# Patient Record
Sex: Female | Born: 1965 | Race: Black or African American | Hispanic: No | Marital: Married | Smoking: Never smoker
Health system: Southern US, Community
[De-identification: ages and names within clinical notes are randomized; demographics above are authoritative.]

## PROBLEM LIST (undated history)

## (undated) DIAGNOSIS — D649 Anemia, unspecified: Secondary | ICD-10-CM

## (undated) DIAGNOSIS — Z889 Allergy status to unspecified drugs, medicaments and biological substances status: Secondary | ICD-10-CM

## (undated) DIAGNOSIS — R011 Cardiac murmur, unspecified: Secondary | ICD-10-CM

## (undated) HISTORY — PX: TUBAL LIGATION: SHX77

## (undated) HISTORY — PX: DILATION AND CURETTAGE OF UTERUS: SHX78

## (undated) HISTORY — PX: GANGLION CYST EXCISION: SHX1691

---

## 2002-12-17 ENCOUNTER — Encounter (INDEPENDENT_AMBULATORY_CARE_PROVIDER_SITE_OTHER): Payer: Self-pay | Admitting: *Deleted

## 2002-12-17 ENCOUNTER — Ambulatory Visit (HOSPITAL_BASED_OUTPATIENT_CLINIC_OR_DEPARTMENT_OTHER): Admission: RE | Admit: 2002-12-17 | Discharge: 2002-12-17 | Payer: Self-pay | Admitting: Orthopedic Surgery

## 2006-05-07 ENCOUNTER — Encounter: Admission: RE | Admit: 2006-05-07 | Discharge: 2006-05-07 | Payer: Self-pay | Admitting: Obstetrics and Gynecology

## 2007-02-13 ENCOUNTER — Encounter: Admission: RE | Admit: 2007-02-13 | Discharge: 2007-02-13 | Payer: Self-pay | Admitting: Obstetrics and Gynecology

## 2008-03-01 ENCOUNTER — Encounter: Admission: RE | Admit: 2008-03-01 | Discharge: 2008-03-01 | Payer: Self-pay | Admitting: Internal Medicine

## 2008-03-05 ENCOUNTER — Encounter: Admission: RE | Admit: 2008-03-05 | Discharge: 2008-03-05 | Payer: Self-pay | Admitting: Internal Medicine

## 2008-05-27 ENCOUNTER — Ambulatory Visit (HOSPITAL_COMMUNITY): Admission: RE | Admit: 2008-05-27 | Discharge: 2008-05-27 | Payer: Self-pay | Admitting: Obstetrics and Gynecology

## 2009-03-01 ENCOUNTER — Encounter: Admission: RE | Admit: 2009-03-01 | Discharge: 2009-03-01 | Payer: Self-pay | Admitting: Internal Medicine

## 2009-09-08 ENCOUNTER — Ambulatory Visit (HOSPITAL_COMMUNITY): Admission: RE | Admit: 2009-09-08 | Discharge: 2009-09-08 | Payer: Self-pay | Admitting: Gastroenterology

## 2009-09-20 ENCOUNTER — Ambulatory Visit (HOSPITAL_COMMUNITY): Admission: RE | Admit: 2009-09-20 | Discharge: 2009-09-20 | Payer: Self-pay | Admitting: Gastroenterology

## 2010-03-02 ENCOUNTER — Encounter: Admission: RE | Admit: 2010-03-02 | Discharge: 2010-03-02 | Payer: Self-pay | Admitting: Obstetrics and Gynecology

## 2010-07-31 ENCOUNTER — Encounter: Payer: Self-pay | Admitting: Internal Medicine

## 2010-08-31 ENCOUNTER — Other Ambulatory Visit: Payer: Self-pay | Admitting: Obstetrics and Gynecology

## 2010-08-31 DIAGNOSIS — Z1231 Encounter for screening mammogram for malignant neoplasm of breast: Secondary | ICD-10-CM

## 2010-11-24 NOTE — Op Note (Signed)
NAMETerrace Johnston                           ACCOUNT NO.:  [id number]   MEDICAL RECORD NO.:  [id number]                   PATIENT TYPE:  AMB   LOCATION:  DSC                                  FACILITY:  [facility]   PHYSICIAN:  Katy Fitch. Naaman Plummer., M.D.          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   OPERATION PERFORMED:  Excision of a large ganglion dorsal aspect of left  wrist with arthrotomy of midcarpal and radiocarpal joints and debridement of  mucinous tissues off dorsal surface of scapholunate interosseous ligament.   SURGEON:  Katy Fitch. Sypher, M.D.   ASSISTANT:  Jonni Sanger, P.A.   ANESTHESIA:  General by LMA.   SUPERVISING ANESTHESIOLOGIST:  Janetta Hora. Gelene Mink, M.D.   INDICATIONS FOR PROCEDURE:  Kristin Johnston is a 45 year old woman who  presented for evaluation and management of a large mass on the dorsal aspect  of her left wrist.  More than five years ago, she has a mass excised from  the dorsal aspect of the wrist consistent with a ganglion.  After about one  year she had a recurrence.  She has tolerated this for a while but now has a  very large and unsightly mass on the dorsal aspect of her left wrist. She  requested a second attempted excision.  We explained to her that the  ganglion phenomenon is poorly understood.  I explained that conventional  treatment involved circumferential dissection of the mass and inspection of  the radiocarpal and midcarpal joints in an effort to remove the cells that  were creating mucinous joint type fluid on the exterior of the wrist  capsule.   We also recommended exploration of the scapholunate interosseous ligament  region  looking for injury to the ligament, possibly provoking the ganglion  phenomenon.  She understands that despite our best efforts these can recur.   DESCRIPTION OF PROCEDURE:  Kristin Johnston was brought to the operating room  and placed in supine position on the operating table.  Following induction  of general anesthesia by LMA, the left arm was prepped with Betadine soap  and solution and sterilely draped.  Following exsanguination of the limb  with an Esmarch bandage, an arterial tourniquet on the proximal brachium was  inflated to 220 mmHg.  The procedure commenced with an elliptical excision  of skin to remove skin that had been stretched by the enlarging mass.   Subcutaneous tissue was carefully divided taking care to identify the  extensor retinaculum.  The cyst was circumferentially dissected sparing the  retinaculum.  This was followed down to the level of the wrist capsule.  An  arthrotomy of the midcarpal and radiocarpal joints was accomplished followed  by following the stem of the lesion to the region of scapholunate  interosseous ligament.  There was mucinous tissue on the dorsal aspect of  the scapholunate interosseous ligament and some abnormal yellow-appearing  ligament fibers but no sign of ligament injury.  All of the abnormal tissues  were debrided with a rongeur.  The midcarpal arthrotomy and radiocarpal  arthrotomy sites were repaired with mattress sutures of 4-0 Vicryl followed  by irrigation of the wound and repair of the skin with subdermal sutures of  4-0 Vicryl and intradermal 3-0 Prolene.  There were no other apparent  pathologies or complications noted.   Kristin Johnston was placed in a compressive dressing with a volar plaster splint  maintaining the wrist in five degrees dorsiflexion.  For aftercare she was  given prescriptions for Percocet 5 mg one or two tablets by mouth every four  to six hours as needed for pain, 20 tablets without refill.  Also Keflex 500  mg 1  by mouth every eight hours times four days as a postoperative  prophylactic antibiotic.  She was given 12 tablets.                                                Katy Fitch Naaman Plummer., M.D.    RVS/MEDQ  D:  12/17/2002  T:  12/18/2002  Job:  [id number]

## 2011-03-05 ENCOUNTER — Ambulatory Visit
Admission: RE | Admit: 2011-03-05 | Discharge: 2011-03-05 | Disposition: A | Discharge status: Home / Self Care | Payer: BC Managed Care – PPO | Source: Ambulatory Visit | Attending: Obstetrics and Gynecology | Admitting: Obstetrics and Gynecology

## 2011-03-05 DIAGNOSIS — Z1231 Encounter for screening mammogram for malignant neoplasm of breast: Secondary | ICD-10-CM

## 2011-09-07 ENCOUNTER — Other Ambulatory Visit: Payer: Self-pay | Admitting: Obstetrics and Gynecology

## 2011-09-07 DIAGNOSIS — Z1231 Encounter for screening mammogram for malignant neoplasm of breast: Secondary | ICD-10-CM

## 2012-03-05 ENCOUNTER — Ambulatory Visit: Payer: BC Managed Care – PPO

## 2012-03-05 ENCOUNTER — Ambulatory Visit
Admission: RE | Admit: 2012-03-05 | Discharge: 2012-03-05 | Disposition: A | Discharge status: Home / Self Care | Payer: BC Managed Care – PPO | Source: Ambulatory Visit | Attending: Obstetrics and Gynecology | Admitting: Obstetrics and Gynecology

## 2012-03-05 DIAGNOSIS — Z1231 Encounter for screening mammogram for malignant neoplasm of breast: Secondary | ICD-10-CM

## 2013-03-05 ENCOUNTER — Other Ambulatory Visit: Payer: Self-pay

## 2013-03-05 DIAGNOSIS — Z1231 Encounter for screening mammogram for malignant neoplasm of breast: Secondary | ICD-10-CM

## 2013-03-23 ENCOUNTER — Ambulatory Visit
Admission: RE | Admit: 2013-03-23 | Discharge: 2013-03-23 | Disposition: A | Discharge status: Home / Self Care | Payer: BC Managed Care – PPO | Source: Ambulatory Visit

## 2013-03-23 DIAGNOSIS — Z1231 Encounter for screening mammogram for malignant neoplasm of breast: Secondary | ICD-10-CM

## 2015-02-23 ENCOUNTER — Other Ambulatory Visit: Payer: Self-pay

## 2015-02-23 DIAGNOSIS — Z1231 Encounter for screening mammogram for malignant neoplasm of breast: Secondary | ICD-10-CM

## 2015-02-28 ENCOUNTER — Ambulatory Visit
Admission: RE | Admit: 2015-02-28 | Discharge: 2015-02-28 | Disposition: A | Discharge status: Home / Self Care | Payer: BC Managed Care – PPO | Source: Ambulatory Visit

## 2015-02-28 DIAGNOSIS — Z1231 Encounter for screening mammogram for malignant neoplasm of breast: Secondary | ICD-10-CM

## 2015-03-07 ENCOUNTER — Other Ambulatory Visit: Payer: Self-pay | Admitting: Obstetrics and Gynecology

## 2015-03-07 DIAGNOSIS — N926 Irregular menstruation, unspecified: Secondary | ICD-10-CM

## 2015-03-07 DIAGNOSIS — D259 Leiomyoma of uterus, unspecified: Secondary | ICD-10-CM

## 2015-03-09 ENCOUNTER — Ambulatory Visit
Admission: RE | Admit: 2015-03-09 | Discharge: 2015-03-09 | Disposition: A | Discharge status: Home / Self Care | Payer: BC Managed Care – PPO | Source: Ambulatory Visit | Attending: Obstetrics and Gynecology | Admitting: Obstetrics and Gynecology

## 2015-03-09 DIAGNOSIS — N926 Irregular menstruation, unspecified: Secondary | ICD-10-CM

## 2015-03-09 DIAGNOSIS — D259 Leiomyoma of uterus, unspecified: Secondary | ICD-10-CM

## 2016-03-07 ENCOUNTER — Other Ambulatory Visit: Payer: Self-pay | Admitting: Internal Medicine

## 2016-03-07 DIAGNOSIS — Z1231 Encounter for screening mammogram for malignant neoplasm of breast: Secondary | ICD-10-CM

## 2016-03-14 ENCOUNTER — Other Ambulatory Visit: Payer: Self-pay | Admitting: Gastroenterology

## 2016-03-15 ENCOUNTER — Ambulatory Visit
Admission: RE | Admit: 2016-03-15 | Discharge: 2016-03-15 | Disposition: A | Discharge status: Home / Self Care | Payer: BC Managed Care – PPO | Source: Ambulatory Visit | Attending: Internal Medicine | Admitting: Internal Medicine

## 2016-03-15 DIAGNOSIS — Z1231 Encounter for screening mammogram for malignant neoplasm of breast: Secondary | ICD-10-CM

## 2016-04-16 ENCOUNTER — Encounter (HOSPITAL_COMMUNITY): Payer: Self-pay | Admitting: *Deleted

## 2016-04-23 ENCOUNTER — Encounter (HOSPITAL_COMMUNITY)
Admission: RE | Disposition: A | Discharge status: Home / Self Care | Payer: Self-pay | Source: Ambulatory Visit | Attending: Gastroenterology

## 2016-04-23 ENCOUNTER — Ambulatory Visit (HOSPITAL_COMMUNITY)
Admission: RE | Admit: 2016-04-23 | Discharge: 2016-04-23 | Disposition: A | Discharge status: Home / Self Care | Payer: BC Managed Care – PPO | Source: Ambulatory Visit | Attending: Gastroenterology | Admitting: Gastroenterology

## 2016-04-23 ENCOUNTER — Ambulatory Visit (HOSPITAL_COMMUNITY): Payer: BC Managed Care – PPO | Admitting: Certified Registered Nurse Anesthetist

## 2016-04-23 ENCOUNTER — Encounter (HOSPITAL_COMMUNITY): Payer: Self-pay

## 2016-04-23 DIAGNOSIS — K562 Volvulus: Secondary | ICD-10-CM | POA: Insufficient documentation

## 2016-04-23 DIAGNOSIS — Z1211 Encounter for screening for malignant neoplasm of colon: Secondary | ICD-10-CM | POA: Diagnosis not present

## 2016-04-23 HISTORY — DX: Cardiac murmur, unspecified: R01.1

## 2016-04-23 HISTORY — DX: Allergy status to unspecified drugs, medicaments and biological substances: Z88.9

## 2016-04-23 HISTORY — PX: COLONOSCOPY WITH PROPOFOL: SHX5780

## 2016-04-23 HISTORY — DX: Anemia, unspecified: D64.9

## 2016-04-23 SURGERY — COLONOSCOPY WITH PROPOFOL
Anesthesia: Monitor Anesthesia Care

## 2016-04-23 MED ORDER — LACTATED RINGERS IV SOLN
INTRAVENOUS | Status: DC
  Administered 2016-04-23: 11:00:00 via INTRAVENOUS

## 2016-04-23 MED ORDER — PROPOFOL 10 MG/ML IV BOLUS
INTRAVENOUS | Status: DC | PRN
  Administered 2016-04-23 (×2): 20 mg via INTRAVENOUS

## 2016-04-23 MED ORDER — PROPOFOL 500 MG/50ML IV EMUL
INTRAVENOUS | Status: DC | PRN
  Administered 2016-04-23: 150 ug/kg/min via INTRAVENOUS

## 2016-04-23 MED ORDER — LIDOCAINE 2% (20 MG/ML) 5 ML SYRINGE
INTRAMUSCULAR | Status: AC
  Filled 2016-04-23: qty 5

## 2016-04-23 MED ORDER — ONDANSETRON HCL 4 MG/2ML IJ SOLN
INTRAMUSCULAR | Status: DC | PRN
  Administered 2016-04-23: 4 mg via INTRAVENOUS

## 2016-04-23 MED ORDER — PROPOFOL 10 MG/ML IV BOLUS
INTRAVENOUS | Status: AC
  Filled 2016-04-23: qty 40

## 2016-04-23 MED ORDER — LIDOCAINE 2% (20 MG/ML) 5 ML SYRINGE
INTRAMUSCULAR | Status: DC | PRN
  Administered 2016-04-23: 60 mg via INTRAVENOUS

## 2016-04-23 MED ORDER — ONDANSETRON HCL 4 MG/2ML IJ SOLN
INTRAMUSCULAR | Status: AC
  Filled 2016-04-23: qty 2

## 2016-04-23 MED ORDER — SODIUM CHLORIDE 0.9 % IV SOLN: INTRAVENOUS | Status: DC

## 2016-04-23 SURGICAL SUPPLY — 21 items

## 2016-04-23 NOTE — Transfer of Care (Signed)
Immediate Anesthesia Transfer of Care Note  Patient: Kristin Johnston  Procedure(s) Performed: Procedure(s): COLONOSCOPY WITH PROPOFOL (N/A)  Patient Location: PACU  Anesthesia Type:MAC  Level of Consciousness:  sedated, patient cooperative and responds to stimulation  Airway & Oxygen Therapy:Patient Spontanous Breathing and Patient connected to face mask oxgen  Post-op Assessment:  Report given to PACU RN and Post -op Vital signs reviewed and stable  Post vital signs:  Reviewed and stable  Last Vitals:  Vitals:   04/23/16 1116 04/23/16 1307  BP: (!) 152/91 (!) 157/89  Pulse: (!) 12 61  Resp: 12 17  Temp: [id number] C     Complications: No apparent anesthesia complications

## 2016-04-23 NOTE — Op Note (Addendum)
[facility] Patient Name: Kristin Johnston Procedure Date: 04/23/2016 MRN: [id number] Attending MD: Garlan Fair , MD Date of Birth: 1966-05-29 CSN: [id number] Age: 50 Admit Type: Outpatient Procedure:                Colonoscopy Indications:              Screening for colorectal malignant neoplasm Providers:                Garlan Fair, MD, Malka So, RN, Alfonso Patten, Technician, Edman Circle. Zenia Resides CRNA, CRNA Referring MD:              Medicines:                Propofol per Anesthesia Complications:            No immediate complications. Estimated Blood Loss:     Estimated blood loss: none. Procedure:                Pre-Anesthesia Assessment:                           - Prior to the procedure, a History and Physical                            was performed, and patient medications and                            allergies were reviewed. The patient's tolerance of                            previous anesthesia was also reviewed. The risks                            and benefits of the procedure and the sedation                            options and risks were discussed with the patient.                            All questions were answered, and informed consent                            was obtained. Prior Anticoagulants: The patient has                            taken no previous anticoagulant or antiplatelet                            agents. ASA Grade Assessment: II - A patient with                            mild systemic disease. After reviewing the risks  and benefits, the patient was deemed in                            satisfactory condition to undergo the procedure.                           After obtaining informed consent, the colonoscope                            was passed under direct vision. Throughout the                            procedure, the patient's blood pressure, pulse, and                             oxygen saturations were monitored continuously. The                            [id number] scope was introduced through                            the anus and advanced to the the cecum, identified                            by appendiceal orifice and ileocecal valve. The                            colonoscopy was somewhat difficult due to                            significant looping. The patient tolerated the                            procedure well. The quality of the bowel                            preparation was good. The ileocecal valve, the                            appendiceal orifice and the rectum were                            photographed. Scope In: 12:38:17 PM Scope Out: 1:00:37 PM Scope Withdrawal Time: 0 hours 7 minutes 56 seconds  Total Procedure Duration: 0 hours 22 minutes 20 seconds  Findings:      The perianal and digital rectal examinations were normal.      The entire examined colon appeared normal. Impression:               - The entire examined colon is normal.                           - No specimens collected. Moderate Sedation:      N/A- Per Anesthesia Care Recommendation:           -  Patient has a contact number available for                            emergencies. The signs and symptoms of potential                            delayed complications were discussed with the                            patient. Return to normal activities tomorrow.                            Written discharge instructions were provided to the                            patient.                           - Repeat colonoscopy in 10 years for screening                            purposes.                           - Resume previous diet.                           - Continue present medications. Procedure Code(s):        --- Professional ---                           [id number], Colorectal cancer screening; colonoscopy on                             individual not meeting criteria for high risk Diagnosis Code(s):        --- Professional ---                           Z12.11, Encounter for screening for malignant                            neoplasm of colon CPT copyright 2016 American [employer] Association. All rights reserved. The codes documented in this report are preliminary and upon coder review may  be revised to meet current compliance requirements. Earle Gell, MD Garlan Fair, MD 04/23/2016 1:06:11 PM This report has been signed electronically. Number of Addenda: 0

## 2016-04-23 NOTE — Discharge Instructions (Signed)

## 2016-04-23 NOTE — Anesthesia Postprocedure Evaluation (Signed)
Anesthesia Post Note  Patient: Kristin Johnston  Procedure(s) Performed: Procedure(s) (LRB): COLONOSCOPY WITH PROPOFOL (N/A)  Patient location during evaluation: Endoscopy Anesthesia Type: MAC Level of consciousness: awake and alert Pain management: pain level controlled Vital Signs Assessment: post-procedure vital signs reviewed and stable Respiratory status: spontaneous breathing, nonlabored ventilation, respiratory function stable and patient connected to nasal cannula oxygen Cardiovascular status: stable and blood pressure returned to baseline Anesthetic complications: no    Last Vitals:  Vitals:   04/23/16 1330 04/23/16 1410  BP: (!) 186/111 (!) 148/97  Pulse:    Resp:    Temp:      Last Pain:  Vitals:   04/23/16 1307  TempSrc: Oral                 Jakub Debold,JAMES TERRILL

## 2016-04-23 NOTE — H&P (Signed)
Procedure: Baseline screening colonoscopy  History: The patient is a 50 year old female born [birth year]. She is scheduled to undergo a screening colonoscopy today  Medication allergies: None  Past medical history: Allergic rhinitis. Cesarean section.  Exam: The patient is alert and lying comfortably on the endoscopy stretcher. Abdomen is soft and nontender to palpation. Lungs are clear to auscultation. Cardiac exam reveals a regular rhythm.  Plan: Proceed with screening colonoscopy

## 2016-04-23 NOTE — Anesthesia Preprocedure Evaluation (Signed)
Anesthesia Evaluation  Patient identified by MRN, date of birth, ID band Patient awake    Reviewed: Allergy & Precautions, NPO status , Patient's Chart, lab work & pertinent test results  History of Anesthesia Complications Negative for: history of anesthetic complications  Airway Mallampati: I       Dental  (+) Teeth Intact   Pulmonary neg pulmonary ROS,    breath sounds clear to auscultation       Cardiovascular negative cardio ROS  + Valvular Problems/Murmurs  Rhythm:Regular Rate:Normal     Neuro/Psych negative neurological ROS     GI/Hepatic negative GI ROS, Neg liver ROS,   Endo/Other  negative endocrine ROS  Renal/GU negative Renal ROS     Musculoskeletal negative musculoskeletal ROS (+)   Abdominal   Peds  Hematology negative hematology ROS (+)   Anesthesia Other Findings   Reproductive/Obstetrics                             Anesthesia Physical Anesthesia Plan  ASA: I  Anesthesia Plan: MAC   Post-op Pain Management:    Induction: Intravenous  Airway Management Planned: Natural Airway and Simple Face Mask  Additional Equipment:   Intra-op Plan:   Post-operative Plan:   Informed Consent: I have reviewed the patients History and Physical, chart, labs and discussed the procedure including the risks, benefits and alternatives for the proposed anesthesia with the patient or authorized representative who has indicated his/her understanding and acceptance.     Plan Discussed with: CRNA  Anesthesia Plan Comments:         Anesthesia Quick Evaluation

## 2016-04-24 ENCOUNTER — Encounter (HOSPITAL_COMMUNITY): Payer: Self-pay | Admitting: Gastroenterology

## 2017-02-12 ENCOUNTER — Encounter (HOSPITAL_COMMUNITY): Payer: Self-pay

## 2017-02-12 ENCOUNTER — Emergency Department (HOSPITAL_COMMUNITY)
Admission: EM | Admit: 2017-02-12 | Discharge: 2017-02-13 | Disposition: A | Discharge status: Home / Self Care | Payer: BC Managed Care – PPO | Attending: Emergency Medicine | Admitting: Emergency Medicine

## 2017-02-12 ENCOUNTER — Emergency Department (HOSPITAL_COMMUNITY): Payer: BC Managed Care – PPO

## 2017-02-12 DIAGNOSIS — M25562 Pain in left knee: Secondary | ICD-10-CM | POA: Insufficient documentation

## 2017-02-12 DIAGNOSIS — Y929 Unspecified place or not applicable: Secondary | ICD-10-CM | POA: Insufficient documentation

## 2017-02-12 DIAGNOSIS — Y939 Activity, unspecified: Secondary | ICD-10-CM | POA: Insufficient documentation

## 2017-02-12 DIAGNOSIS — Y999 Unspecified external cause status: Secondary | ICD-10-CM | POA: Diagnosis not present

## 2017-02-12 DIAGNOSIS — S39011A Strain of muscle, fascia and tendon of abdomen, initial encounter: Secondary | ICD-10-CM | POA: Diagnosis not present

## 2017-02-12 DIAGNOSIS — R1030 Lower abdominal pain, unspecified: Secondary | ICD-10-CM | POA: Diagnosis present

## 2017-02-12 DIAGNOSIS — T148XXA Other injury of unspecified body region, initial encounter: Secondary | ICD-10-CM

## 2017-02-12 LAB — POC URINE PREG, ED: PREG TEST UR: NEGATIVE
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
## 2017-02-13 MED ORDER — METHOCARBAMOL 500 MG PO TABS: 500.0000 mg | ORAL_TABLET | Freq: Two times a day (BID) | ORAL | 0 refills | Status: DC

## 2017-02-13 NOTE — Discharge Instructions (Signed)
Take 600 mg ibuprofen every 6 hours for pain control. You may alternate this with Tylenol as needed. Take Robaxin as prescribed for muscle spasms. Alternate ice and heat to areas of injury to limit swelling and spasm. Follow-up with your primary care doctor in 1 week for recheck of symptoms.

## 2017-02-22 ENCOUNTER — Other Ambulatory Visit: Payer: Self-pay | Admitting: Internal Medicine

## 2017-02-22 DIAGNOSIS — Z1231 Encounter for screening mammogram for malignant neoplasm of breast: Secondary | ICD-10-CM

## 2017-03-18 ENCOUNTER — Ambulatory Visit
Admission: RE | Admit: 2017-03-18 | Discharge: 2017-03-18 | Disposition: A | Discharge status: Home / Self Care | Payer: BC Managed Care – PPO | Source: Ambulatory Visit | Attending: Internal Medicine | Admitting: Internal Medicine

## 2017-03-18 DIAGNOSIS — Z1231 Encounter for screening mammogram for malignant neoplasm of breast: Secondary | ICD-10-CM

## 2017-03-20 ENCOUNTER — Other Ambulatory Visit: Payer: Self-pay | Admitting: Internal Medicine

## 2017-03-20 DIAGNOSIS — R928 Other abnormal and inconclusive findings on diagnostic imaging of breast: Secondary | ICD-10-CM

## 2017-03-25 ENCOUNTER — Ambulatory Visit
Admission: RE | Admit: 2017-03-25 | Discharge: 2017-03-25 | Disposition: A | Discharge status: Home / Self Care | Payer: BC Managed Care – PPO | Source: Ambulatory Visit | Attending: Internal Medicine | Admitting: Internal Medicine

## 2017-03-25 ENCOUNTER — Other Ambulatory Visit: Payer: Self-pay | Admitting: Internal Medicine

## 2017-03-25 DIAGNOSIS — N6489 Other specified disorders of breast: Secondary | ICD-10-CM

## 2017-03-25 DIAGNOSIS — R928 Other abnormal and inconclusive findings on diagnostic imaging of breast: Secondary | ICD-10-CM

## 2017-07-10 ENCOUNTER — Encounter: Payer: Self-pay | Admitting: Obstetrics & Gynecology

## 2017-07-10 ENCOUNTER — Ambulatory Visit (INDEPENDENT_AMBULATORY_CARE_PROVIDER_SITE_OTHER): Payer: BC Managed Care – PPO | Admitting: Obstetrics & Gynecology

## 2017-07-10 VITALS — BP 153/99 | HR 71 | Ht 63.0 in | Wt 161.0 lb

## 2017-07-10 DIAGNOSIS — D250 Submucous leiomyoma of uterus: Secondary | ICD-10-CM

## 2017-07-10 DIAGNOSIS — D251 Intramural leiomyoma of uterus: Secondary | ICD-10-CM

## 2017-07-10 DIAGNOSIS — D252 Subserosal leiomyoma of uterus: Secondary | ICD-10-CM

## 2017-07-10 DIAGNOSIS — R030 Elevated blood-pressure reading, without diagnosis of hypertension: Secondary | ICD-10-CM

## 2017-07-10 DIAGNOSIS — N939 Abnormal uterine and vaginal bleeding, unspecified: Secondary | ICD-10-CM

## 2017-07-10 NOTE — Progress Notes (Signed)
Subjective:     Kristin Johnston is a 52 y.o. female here for eval of AUB and fibroids and for surgical consult. G1P1001 LMP 07/09/2017 06/16/2017 menses last 6 days. Cycles are 2 weeks to 4 weeks. Current complaints: AUB thought due to uterine fibroids.  Pt is s/p an endo bx 06/06/2017 per pt results WNL. Last PAP 05/08/2017- WNL.  Pt reports extremely heavy cycles with changing of pads every hour to hour and 1/2.  Pt reports a low iron. No FH of breast or ovarian cancer.   Gynecologic History No LMP recorded. Patient is not currently having periods (Reason: Perimenopausal). Contraception: s/p BTL 2007 Last Pap: 05/08/2017. Results were: normal Last mammogram: 03/2017. Results were: repeated for an abnormal result. The follow up was normal   Obstetric History G1P1 S/p c/section  The following portions of the patient's history were reviewed and updated as appropriate: allergies, current medications, past family history, past medical history, past social history, past surgical history and problem list. Pt denies  Tob. ETOH: beers on weekends.   Review of Systems Pertinent items are noted in HPI.    Objective:  BP (!) 153/99   Pulse 71   Ht 5\' 3"  (1.6 m)   Wt 161 lb (73 kg)   BMI 28.52 kg/m   CONSTITUTIONAL: Well-developed, well-nourished female in no acute distress.  HENT:  Normocephalic, atraumatic EYES: Conjunctivae and EOM are normal. No scleral icterus.  NECK: Normal range of motion SKIN: Skin is warm and dry. No rash noted. Not diaphoretic.No pallor. [place]: Alert and oriented to person, place, and time. Normal coordination. Lungs: CTA CV: RRR Abd:  Soft, NT, ND GU: EGBUS: no lesions Vagina: no blood in vault Cervix: no lesion; no mucopurulent d/c Uterus: enlarged; 15 weeks sized; mobile Adnexa: no masses; non tender     05/16/2017 CLINICAL DATA:Uterine leiomyomata  EXAM: TRANSABDOMINAL AND TRANSVAGINAL ULTRASOUND OF PELVIS  TECHNIQUE: Both transabdominal and  transvaginal ultrasound examinations of the pelvis were performed. Transabdominal technique was performed for global imaging of the pelvis including uterus, ovaries, adnexal regions, and pelvic cul-de-sac. It was necessary to proceed with endovaginal exam following the transabdominal exam to visualize the endometrium and RIGHT ovary.  COMPARISON:11/03/2013  FINDINGS: Uterus  Measurements: 15.2 x 7.4 x 7.4 cm. Multiple uterine masses most likely representing leiomyomata. The largest of these measure 3.0 x 2.7 x 2.9 cm subserosal at RIGHT upper uterine segment, subserosal 3.1 x 2.7 x 2.7 cm at the anterior upper uterine segment, and subserosal 2.5 x 2.29 x 2.4 cm at the posterior mid uterus. Small 10 mm submucosal leiomyoma is seen at the mid uterus.  Endometrium  Thickness: 10 mm.No endometrial fluid or focal abnormality  Right ovary  Measurements: 3.0 x 2.0 x 1.8 cm. Normal morphology without mass  Left ovary  Measurements: 3.2 x 1.7 x 2.8 cm. Normal morphology without mass  Other findings  No free pelvic fluid.No adnexal masses.  IMPRESSION: Enlarged uterus containing multiple uterine leiomyomata.   Electronically Signed ByAngelica Ran M.D. On: 05/16/2017 12:08  Other Result Information  Interface, Rad Results In - 05/16/2017 12:11 PM EST CLINICAL DATA:  Uterine leiomyomata  EXAM: TRANSABDOMINAL AND TRANSVAGINAL ULTRASOUND OF PELVIS  TECHNIQUE: Both transabdominal and transvaginal ultrasound examinations of the pelvis were performed. Transabdominal technique was performed for global imaging of the pelvis including uterus, ovaries, adnexal regions, and pelvic cul-de-sac. It was necessary to proceed with endovaginal exam following the transabdominal exam to visualize the endometrium and RIGHT ovary.  COMPARISON:  11/03/2013  FINDINGS: Uterus  Measurements: 15.2 x 7.4 x 7.4 cm. Multiple uterine masses most likely representing leiomyomata. The  largest of these measure 3.0 x 2.7 x 2.9 cm subserosal at RIGHT upper uterine segment, subserosal 3.1 x 2.7 x 2.7 cm at the anterior upper uterine segment, and subserosal 2.5 x 2.29 x 2.4 cm at the posterior mid uterus. Small 10 mm submucosal leiomyoma is seen at the mid uterus.  Endometrium  Thickness: 10 mm.  No endometrial fluid or focal abnormality  Right ovary  Measurements: 3.0 x 2.0 x 1.8 cm. Normal morphology without mass  Left ovary  Measurements: 3.2 x 1.7 x 2.8 cm. Normal morphology without mass  Other findings  No free pelvic fluid.  No adnexal masses.  IMPRESSION: Enlarged uterus containing multiple uterine leiomyomata.    Assessment:   Symptomatic uterine fibroids- AUB with chronic anemia Elevated BP   Plan:  Obtain a blood pressure cuff and check BP daily at different times.  CBC and TSH today  Patient desires surgical management with [place] with bilateral salpingectomy.  The risks of surgery were discussed in detail with the patient including but not limited to: bleeding which may require transfusion or reoperation; infection which may require prolonged hospitalization or re-hospitalization and antibiotic therapy; injury to bowel, bladder, ureters and major vessels or other surrounding organs; need for additional procedures including laparotomy; thromboembolic phenomenon, incisional problems and other postoperative or anesthesia complications.  Patient was told that the likelihood that her condition and symptoms will be treated effectively with this surgical management was very high; the postoperative expectations were also discussed in detail. The patient also understands the alternative treatment options which were discussed in full. All questions were answered.  She was told that she will be contacted by our surgical scheduler regarding the time and date of her surgery; routine preoperative instructions of having nothing to eat or drink after midnight on the day  prior to surgery and also coming to the hospital 1 1/2 hours prior to her time of surgery were also emphasized.  She was told she may be called for a preoperative appointment about a week prior to surgery and will be given further preoperative instructions at that visit. Printed patient education handouts about the procedure were given to the patient to review at home.  Total face-to-face time with patient was 40 min.  Greater than 50% was spent in counseling and coordination of care with the patient.   Kyrel Leighton L. Harraway-Smith, M.D., Cherlynn June

## 2017-07-10 NOTE — Patient Instructions (Addendum)
Total Laparoscopic Hysterectomy, Care After Refer to this sheet in the next few weeks. These instructions provide you with information on caring for yourself after your procedure. Your health care provider may also give you more specific instructions. Your treatment has been planned according to current medical practices, but problems sometimes occur. Call your health care provider if you have any problems or questions after your procedure. What can I expect after the procedure?  Pain and bruising at the incision sites. You will be given pain medicine to control it.  Menopausal symptoms such as hot flashes, night sweats, and insomnia if your ovaries were removed.  Sore throat from the breathing tube that was inserted during surgery. Follow these instructions at home:  Only take over-the-counter or prescription medicines for pain, discomfort, or fever as directed by your health care provider.  Do not take aspirin. It can cause bleeding.  Do not drive when taking pain medicine.  Follow your health care provider's advice regarding diet, exercise, lifting, driving, and general activities.  Resume your usual diet as directed and allowed.  Get plenty of rest and sleep.  Do not douche, use tampons, or have sexual intercourse for at least 6 weeks, or until your health care provider gives you permission.  Change your bandages (dressings) as directed by your health care provider.  Monitor your temperature and notify your health care provider of a fever.  Take showers instead of baths for 2-3 weeks.  Do not drink alcohol until your health care provider gives you permission.  If you develop constipation, you may take a mild laxative with your health care provider's permission. Bran foods may help with constipation problems. Drinking enough fluids to keep your urine clear or pale yellow may help as well.  Try to have someone home with you for 1-2 weeks to help around the house.  Keep all of  your follow-up appointments as directed by your health care provider. Contact a health care provider if:  You have swelling, redness, or increasing pain around your incision sites.  You have pus coming from your incision.  You notice a bad smell coming from your incision.  Your incision breaks open.  You feel dizzy or lightheaded.  You have pain or bleeding when you urinate.  You have persistent diarrhea.  You have persistent nausea and vomiting.  You have abnormal vaginal discharge.  You have a rash.  You have any type of abnormal reaction or develop an allergy to your medicine.  You have poor pain control with your prescribed medicine. Get help right away if:  You have chest pain or shortness of breath.  You have severe abdominal pain that is not relieved with pain medicine.  You have pain or swelling in your legs. This information is not intended to replace advice given to you by your health care provider. Make sure you discuss any questions you have with your health care provider. Document Released: 04/15/2013 Document Revised: 12/01/2015 Document Reviewed: 01/13/2013 Elsevier Interactive Patient Education  2017 [place]. Hysterectomy Information A hysterectomy is a surgery to remove your uterus. After surgery, you will no longer have periods. Also, you will not be able to get pregnant. Reasons for this surgery  You have bleeding that is not normal and keeps coming back.  You have lasting (chronic) lower belly (pelvic) pain.  You have a lasting infection.  The lining of your uterus grows outside your uterus.  The lining of your uterus grows in the muscle of your uterus.  Your uterus falls down into your vagina.  You have a growth in your uterus that causes problems.  You have cells that could turn into cancer (precancerous cells).  You have cancer of the uterus or cervix. Types There are 3 types of hysterectomies. Depending on the type, the surgery  will:  Remove the top part of the uterus only.  Remove the uterus and the cervix.  Remove the uterus, cervix, and tissue that holds the uterus in place in the lower belly.  Ways a hysterectomy can be performed There are 5 ways this surgery can be performed.  A cut (incision) is made in the belly (abdomen). The uterus is taken out through the cut.  A cut is made in the vagina. The uterus is taken out through the cut.  Three or four cuts are made in the belly. A surgical device with a camera is put through one of the cuts. The uterus is cut into small pieces. The uterus is taken out through the cuts or the vagina.  Three or four cuts are made in the belly. A surgical device with a camera is put through one of the cuts. The uterus is taken out through the vagina.  Three or four cuts are made in the belly. A surgical device that is controlled by a computer makes a visual image. The device helps the surgeon control the surgical tools. The uterus is cut into small pieces. The pieces are taken out through the cuts or through the vagina.  What can I expect after the surgery?  You will be given pain medicine.  You will need help at home for 3-5 days after surgery.  You will need to see your doctor in 2-4 weeks after surgery.  You may get hot flashes, have night sweats, and have trouble sleeping.  You may need to have Pap tests in the future if your surgery was related to cancer. Talk to your doctor. It is still good to have regular exams. This information is not intended to replace advice given to you by your health care provider. Make sure you discuss any questions you have with your health care provider. Document Released: 09/17/2011 Document Revised: 12/01/2015 Document Reviewed: 03/02/2013 Elsevier Interactive Patient Education  2018 [employer].  Preventing Hypertension Hypertension, commonly called high blood pressure, is when the force of blood pumping through the arteries is too  strong. Arteries are blood vessels that carry blood from the heart throughout the body. Over time, hypertension can damage the arteries and decrease blood flow to important parts of the body, including the brain, heart, and kidneys. Often, hypertension does not cause symptoms until blood pressure is very high. For this reason, it is important to have your blood pressure checked on a regular basis. Hypertension can often be prevented with diet and lifestyle changes. If you already have hypertension, you can control it with diet and lifestyle changes, as well as medicine. What nutrition changes can be made? Maintain a healthy diet. This includes:  Eating less salt (sodium). Ask your health care provider how much sodium is safe for you to have. The general recommendation is to consume less than 1 tsp (2,300 mg) of sodium a day. ? Do not add salt to your food. ? Choose low-sodium options when grocery shopping and eating out.  Limiting fats in your diet. You can do this by eating low-fat or fat-free dairy products and by eating less red meat.  Eating more fruits, vegetables, and whole grains. Make  a goal to eat: ? 1-2 cups of fresh fruits and vegetables each day. ? 3-4 servings of whole grains each day.  Avoiding foods and beverages that have added sugars.  Eating fish that contain healthy fats (omega-3 fatty acids), such as mackerel or salmon.  If you need help putting together a healthy eating plan, try the DASH diet. This diet is high in fruits, vegetables, and whole grains. It is low in sodium, red meat, and added sugars. DASH stands for Dietary Approaches to Stop Hypertension. What lifestyle changes can be made?  Lose weight if you are overweight. Losing just 3?5% of your body weight can help prevent or control hypertension. ? For example, if your present weight is 200 lb (91 kg), a loss of 3-5% of your weight means losing 6-10 lb (2.7-4.5 kg). ? Ask your health care provider to help you  with a diet and exercise plan to safely lose weight.  Get enough exercise. Do at least 150 minutes of moderate-intensity exercise each week. ? You could do this in short exercise sessions several times a day, or you could do longer exercise sessions a few times a week. For example, you could take a brisk 10-minute walk or bike ride, 3 times a day, for 5 days a week.  Find ways to reduce stress, such as exercising, meditating, listening to music, or taking a yoga class. If you need help reducing stress, ask your health care provider.  Do not smoke. This includes e-cigarettes. Chemicals in tobacco and nicotine products raise your blood pressure each time you smoke. If you need help quitting, ask your health care provider.  Avoid alcohol. If you drink alcohol, limit alcohol intake to no more than 1 drink a day for nonpregnant women and 2 drinks a day for men. One drink equals 12 oz of beer, 5 oz of wine, or 1 oz of hard liquor. Why are these changes important? Diet and lifestyle changes can help you prevent hypertension, and they may make you feel better overall and improve your quality of life. If you have hypertension, making these changes will help you control it and help prevent major complications, such as:  Hardening and narrowing of arteries that supply blood to: ? Your heart. This can cause a heart attack. ? Your brain. This can cause a stroke. ? Your kidneys. This can cause kidney failure.  Stress on your heart muscle, which can cause heart failure.  What can I do to lower my risk?  Work with your health care provider to make a hypertension prevention plan that works for you. Follow your plan and keep all follow-up visits as told by your health care provider.  Learn how to check your blood pressure at home. Make sure that you know your personal target blood pressure, as told by your health care provider. How is this treated? In addition to diet and lifestyle changes, your health care  provider may recommend medicines to help lower your blood pressure. You may need to try a few different medicines to find what works best for you. You also may need to take more than one medicine. Take over-the-counter and prescription medicines only as told by your health care provider. Where to find support: Your health care provider can help you prevent hypertension and help you keep your blood pressure at a healthy level. Your local hospital or your community may also provide support services and prevention programs. The American Heart Association offers an online support network at: CheapBootlegs.com.cy Where  to find more information: Learn more about hypertension from:  [place], Lung, and Blood Institute: ElectronicHangman.is  Centers for Disease Control and Prevention: https://ingram.com/  American Academy of Family Physicians: http://familydoctor.org/familydoctor/en/diseases-conditions/high-blood-pressure.printerview.all.html  Learn more about the DASH diet from:  [place], Lung, and [place]: https://www.reyes.com/  Contact a health care provider if:  You think you are having a reaction to medicines you have taken.  You have recurrent headaches or feel dizzy.  You have swelling in your ankles.  You have trouble with your vision. Summary  Hypertension often does not cause any symptoms until blood pressure is very high. It is important to get your blood pressure checked regularly.  Diet and lifestyle changes are the most important steps in preventing hypertension.  By keeping your blood pressure in a healthy range, you can prevent complications like heart attack, heart failure, stroke, and kidney failure.  Work with your health care provider to make a hypertension prevention plan that works for you. This information is not intended to replace advice given to you  by your health care provider. Make sure you discuss any questions you have with your health care provider. Document Released: 07/10/2015 Document Revised: 03/05/2016 Document Reviewed: 03/05/2016 Elsevier Interactive Patient Education  [employer].

## 2017-07-11 ENCOUNTER — Encounter: Payer: Self-pay | Admitting: Obstetrics & Gynecology

## 2017-07-11 LAB — TSH: TSH: 0.918 u[IU]/mL (ref 0.450–4.500)

## 2017-07-11 LAB — CBC
HEMOGLOBIN: 10.4 g/dL — AB (ref 11.1–15.9)
Hematocrit: 31.7 % — ABNORMAL LOW (ref 34.0–46.6)
MCH: 24.6 pg — AB (ref 26.6–33.0)
MCHC: 32.8 g/dL (ref 31.5–35.7)
MCV: 75 fL — AB (ref 79–97)
Platelets: 292 10*3/uL (ref 150–379)
RBC: 4.22 x10E6/uL (ref 3.77–5.28)
RDW: 13.8 % (ref 12.3–15.4)
WBC: 3.9 10*3/uL (ref 3.4–10.8)

## 2017-07-11 MED ORDER — INTEGRA F 125-1 MG PO CAPS: 1.0000 | ORAL_CAPSULE | Freq: Every day | ORAL | 3 refills | Status: AC

## 2017-07-12 ENCOUNTER — Telehealth: Payer: Self-pay

## 2017-07-12 NOTE — Telephone Encounter (Signed)
Patient called and made aware that she is anemic. Patient made aware we have sent in prescription to her pharmacy for her to take once daily. Patient states understanding. Kathrene Alu RNBSN

## 2017-07-12 NOTE — Telephone Encounter (Signed)
-----   Message from Lavonia Drafts, MD sent at 07/11/2017  9:56 PM EST ----- Please call pt. She is anemic.  She needs Integra. Rx at pharmacy.   Thx, clh-S

## 2017-07-15 ENCOUNTER — Encounter (HOSPITAL_COMMUNITY): Payer: Self-pay

## 2017-08-14 ENCOUNTER — Other Ambulatory Visit: Payer: Self-pay | Admitting: Internal Medicine

## 2017-08-14 DIAGNOSIS — N6489 Other specified disorders of breast: Secondary | ICD-10-CM

## 2017-08-15 DIAGNOSIS — N939 Abnormal uterine and vaginal bleeding, unspecified: Secondary | ICD-10-CM

## 2017-08-15 DIAGNOSIS — D259 Leiomyoma of uterus, unspecified: Secondary | ICD-10-CM

## 2017-08-30 NOTE — Patient Instructions (Signed)
Veyda Kaufman  08/30/2017   Your procedure is scheduled on: 09-10-17   Report to [facility] Main  Entrance Have a seat in the [place]. Please note there is a phone at the [employer]. Please call [phone] on that phone. Someone from Short Stay will come and get you from the Main Lobby and take you to Short Stay.    Call this number if you have problems the morning of surgery [phone]   Remember: Do not eat food or drink liquids :After Midnight.     Take these medicines the morning of surgery with A SIP OF WATER: None                                You may not have any metal on your body including hair pins and              piercings  Do not wear jewelry, make-up, lotions, powders or perfumes, deodorant             Do not wear nail polish.  Do not shave  48 hours prior to surgery.                Do not bring valuables to the hospital. [place].  Contacts, dentures or bridgework may not be worn into surgery.  Leave suitcase in the car. After surgery it may be brought to your room.                Please read over the following fact sheets you were given: _____________________________________________________________________          [facility] - Preparing for Surgery Before surgery, you can play an important role.  Because skin is not sterile, your skin needs to be as free of germs as possible.  You can reduce the number of germs on your skin by washing with CHG (chlorahexidine gluconate) soap before surgery.  CHG is an antiseptic cleaner which kills germs and bonds with the skin to continue killing germs even after washing. Please DO NOT use if you have an allergy to CHG or antibacterial soaps.  If your skin becomes reddened/irritated stop using the CHG and inform your nurse when you arrive at Short Stay. Do not shave (including legs and underarms) for at least 48 hours prior to the  first CHG shower.  You may shave your face/neck. Please follow these instructions carefully:  1.  Shower with CHG Soap the night before surgery and the  morning of Surgery.  2.  If you choose to wash your hair, wash your hair first as usual with your  normal  shampoo.  3.  After you shampoo, rinse your hair and body thoroughly to remove the  shampoo.                           4.  Use CHG as you would any other liquid soap.  You can apply chg directly  to the skin and wash                       Gently with a scrungie or clean washcloth.  5.  Apply the CHG  Soap to your body ONLY FROM THE NECK DOWN.   Do not use on face/ open                           Wound or open sores. Avoid contact with eyes, ears mouth and genitals (private parts).                       Wash face,  Genitals (private parts) with your normal soap.             6.  Wash thoroughly, paying special attention to the area where your surgery  will be performed.  7.  Thoroughly rinse your body with warm water from the neck down.  8.  DO NOT shower/wash with your normal soap after using and rinsing off  the CHG Soap.                9.  Pat yourself dry with a clean towel.            10.  Wear clean pajamas.            11.  Place clean sheets on your bed the night of your first shower and do not  sleep with pets. Day of Surgery : Do not apply any lotions/deodorants the morning of surgery.  Please wear clean clothes to the hospital/surgery center.  FAILURE TO FOLLOW THESE INSTRUCTIONS MAY RESULT IN THE CANCELLATION OF YOUR SURGERY PATIENT SIGNATURE_________________________________  NURSE SIGNATURE__________________________________  ________________________________________________________________________  WHAT IS A BLOOD TRANSFUSION? Blood Transfusion Information  A transfusion is the replacement of blood or some of its parts. Blood is made up of multiple cells which provide different functions.  Red blood cells carry oxygen and are  used for blood loss replacement.  White blood cells fight against infection.  Platelets control bleeding.  Plasma helps clot blood.  Other blood products are available for specialized needs, such as hemophilia or other clotting disorders. BEFORE THE TRANSFUSION  Who gives blood for transfusions?   Healthy volunteers who are fully evaluated to make sure their blood is safe. This is blood bank blood. Transfusion therapy is the safest it has ever been in the practice of medicine. Before blood is taken from a donor, a complete history is taken to make sure that person has no history of diseases nor engages in risky social behavior (examples are intravenous drug use or sexual activity with multiple partners). The donor's travel history is screened to minimize risk of transmitting infections, such as malaria. The donated blood is tested for signs of infectious diseases, such as HIV and hepatitis. The blood is then tested to be sure it is compatible with you in order to minimize the chance of a transfusion reaction. If you or a relative donates blood, this is often done in anticipation of surgery and is not appropriate for emergency situations. It takes many days to process the donated blood. RISKS AND COMPLICATIONS Although transfusion therapy is very safe and saves many lives, the main dangers of transfusion include:   Getting an infectious disease.  Developing a transfusion reaction. This is an allergic reaction to something in the blood you were given. Every precaution is taken to prevent this. The decision to have a blood transfusion has been considered carefully by your caregiver before blood is given. Blood is not given unless the benefits outweigh the risks. AFTER THE TRANSFUSION  Right after receiving a blood transfusion, you  will usually feel much better and more energetic. This is especially true if your red blood cells have gotten low (anemic). The transfusion raises the level of the red  blood cells which carry oxygen, and this usually causes an energy increase.  The nurse administering the transfusion will monitor you carefully for complications. HOME CARE INSTRUCTIONS  No special instructions are needed after a transfusion. You may find your energy is better. Speak with your caregiver about any limitations on activity for underlying diseases you may have. SEEK MEDICAL CARE IF:   Your condition is not improving after your transfusion.  You develop redness or irritation at the intravenous (IV) site. SEEK IMMEDIATE MEDICAL CARE IF:  Any of the following symptoms occur over the next 12 hours:  Shaking chills.  You have a temperature by mouth above 102 F (38.9 C), not controlled by medicine.  Chest, back, or muscle pain.  People around you feel you are not acting correctly or are confused.  Shortness of breath or difficulty breathing.  Dizziness and fainting.  You get a rash or develop hives.  You have a decrease in urine output.  Your urine turns a dark color or changes to pink, red, or brown. Any of the following symptoms occur over the next 10 days:  You have a temperature by mouth above 102 F (38.9 C), not controlled by medicine.  Shortness of breath.  Weakness after normal activity.  The white part of the eye turns yellow (jaundice).  You have a decrease in the amount of urine or are urinating less often.  Your urine turns a dark color or changes to pink, red, or brown. Document Released: 06/22/2000 Document Revised: 09/17/2011 Document Reviewed: 02/09/2008 [facility] Patient Information 2014 [place], [place].  _______________________________________________________________________

## 2017-09-02 ENCOUNTER — Encounter (HOSPITAL_COMMUNITY): Payer: Self-pay

## 2017-09-02 ENCOUNTER — Encounter (HOSPITAL_COMMUNITY)
Admission: RE | Admit: 2017-09-02 | Discharge: 2017-09-02 | Disposition: A | Discharge status: Home / Self Care | Payer: BC Managed Care – PPO | Source: Ambulatory Visit | Attending: Obstetrics & Gynecology | Admitting: Obstetrics & Gynecology

## 2017-09-02 ENCOUNTER — Other Ambulatory Visit: Payer: Self-pay

## 2017-09-02 DIAGNOSIS — Z0183 Encounter for blood typing: Secondary | ICD-10-CM | POA: Insufficient documentation

## 2017-09-02 DIAGNOSIS — I519 Heart disease, unspecified: Secondary | ICD-10-CM | POA: Insufficient documentation

## 2017-09-02 DIAGNOSIS — Z01812 Encounter for preprocedural laboratory examination: Secondary | ICD-10-CM | POA: Diagnosis not present

## 2017-09-02 DIAGNOSIS — N939 Abnormal uterine and vaginal bleeding, unspecified: Secondary | ICD-10-CM | POA: Insufficient documentation

## 2017-09-02 DIAGNOSIS — D259 Leiomyoma of uterus, unspecified: Secondary | ICD-10-CM | POA: Insufficient documentation

## 2017-09-02 DIAGNOSIS — Z01818 Encounter for other preprocedural examination: Secondary | ICD-10-CM | POA: Insufficient documentation

## 2017-09-02 LAB — CBC
HEMATOCRIT: 34.1 % — AB (ref 36.0–46.0)
HEMOGLOBIN: 11 g/dL — AB (ref 12.0–15.0)
MCH: 25.5 pg — AB (ref 26.0–34.0)
MCHC: 32.3 g/dL (ref 30.0–36.0)
MCV: 78.9 fL (ref 78.0–100.0)
Platelets: 339 10*3/uL (ref 150–400)
RBC: 4.32 MIL/uL (ref 3.87–5.11)
RDW: 15.6 % — ABNORMAL HIGH (ref 11.5–15.5)
WBC: 4.7 10*3/uL (ref 4.0–10.5)

## 2017-09-02 LAB — BASIC METABOLIC PANEL
ANION GAP: 9 (ref 5–15)
BUN: 7 mg/dL (ref 6–20)
CALCIUM: 8.7 mg/dL — AB (ref 8.9–10.3)
CO2: 22 mmol/L (ref 22–32)
Chloride: 108 mmol/L (ref 101–111)
Creatinine, Ser: 0.67 mg/dL (ref 0.44–1.00)
GFR calc Af Amer: >60 mL/min (ref >60)
GFR calc non Af Amer: >60 mL/min (ref >60)
GLUCOSE: 105 mg/dL — AB (ref 65–99)
Potassium: 3.8 mmol/L (ref 3.5–5.1)
Sodium: 139 mmol/L (ref 135–145)

## 2017-09-02 LAB — ABO/RH: ABO/RH(D): O POS

## 2017-09-02 LAB — PREGNANCY, URINE: Preg Test, Ur: NEGATIVE

## 2017-09-04 NOTE — Progress Notes (Signed)
Final EKG results discussed with Dr. Ola Spurr, Anesthesiologist. Pt okay to proceed with surgery.

## 2017-09-09 ENCOUNTER — Ambulatory Visit: Payer: BC Managed Care – PPO

## 2017-09-09 ENCOUNTER — Ambulatory Visit
Admission: RE | Admit: 2017-09-09 | Discharge: 2017-09-09 | Disposition: A | Discharge status: Home / Self Care | Payer: BC Managed Care – PPO | Source: Ambulatory Visit | Attending: Internal Medicine | Admitting: Internal Medicine

## 2017-09-09 DIAGNOSIS — N6489 Other specified disorders of breast: Secondary | ICD-10-CM

## 2017-09-09 NOTE — Anesthesia Preprocedure Evaluation (Addendum)
Anesthesia Evaluation  Patient identified by MRN, date of birth, ID band Patient awake    Reviewed: Allergy & Precautions, NPO status , Patient's Chart, lab work & pertinent test results  Airway Mallampati: II  TM Distance: >3 FB Neck ROM: Full    Dental  (+) Dental Advisory Given   Pulmonary neg pulmonary ROS,    breath sounds clear to auscultation       Cardiovascular negative cardio ROS   Rhythm:Regular Rate:Normal     Neuro/Psych negative neurological ROS     GI/Hepatic negative GI ROS, Neg liver ROS,   Endo/Other  negative endocrine ROS  Renal/GU negative Renal ROS     Musculoskeletal   Abdominal   Peds  Hematology  (+) anemia ,   Anesthesia Other Findings   Reproductive/Obstetrics                            Lab Results  Component Value Date   WBC 4.7 09/02/2017   HGB 11.0 (L) 09/02/2017   HCT 34.1 (L) 09/02/2017   MCV 78.9 09/02/2017   PLT 339 09/02/2017   Lab Results  Component Value Date   CREATININE 0.67 09/02/2017   BUN 7 09/02/2017   NA 139 09/02/2017   K 3.8 09/02/2017   CL 108 09/02/2017   CO2 22 09/02/2017    Anesthesia Physical Anesthesia Plan  ASA: II  Anesthesia Plan: General   Post-op Pain Management:    Induction: Intravenous  PONV Risk Score and Plan: 4 or greater and Scopolamine patch - Pre-op, Midazolam, Dexamethasone, Ondansetron and Treatment may vary due to age or medical condition  Airway Management Planned: Oral ETT  Additional Equipment:   Intra-op Plan:   Post-operative Plan: Extubation in OR  Informed Consent: I have reviewed the patients History and Physical, chart, labs and discussed the procedure including the risks, benefits and alternatives for the proposed anesthesia with the patient or authorized representative who has indicated his/her understanding and acceptance.   Dental advisory given  Plan Discussed with:  CRNA  Anesthesia Plan Comments:        Anesthesia Quick Evaluation

## 2017-09-10 ENCOUNTER — Ambulatory Visit (HOSPITAL_COMMUNITY): Payer: BC Managed Care – PPO | Admitting: Anesthesiology

## 2017-09-10 ENCOUNTER — Encounter (HOSPITAL_COMMUNITY)
Admission: RE | Disposition: A | Discharge status: Home / Self Care | Payer: Self-pay | Source: Ambulatory Visit | Attending: Obstetrics & Gynecology

## 2017-09-10 ENCOUNTER — Encounter (HOSPITAL_COMMUNITY): Payer: Self-pay

## 2017-09-10 ENCOUNTER — Ambulatory Visit (HOSPITAL_COMMUNITY)
Admission: RE | Admit: 2017-09-10 | Discharge: 2017-09-10 | Disposition: A | Discharge status: Home / Self Care | Payer: BC Managed Care – PPO | Source: Ambulatory Visit | Attending: Obstetrics & Gynecology | Admitting: Obstetrics & Gynecology

## 2017-09-10 DIAGNOSIS — D539 Nutritional anemia, unspecified: Secondary | ICD-10-CM | POA: Diagnosis not present

## 2017-09-10 DIAGNOSIS — N939 Abnormal uterine and vaginal bleeding, unspecified: Secondary | ICD-10-CM

## 2017-09-10 DIAGNOSIS — N938 Other specified abnormal uterine and vaginal bleeding: Secondary | ICD-10-CM | POA: Insufficient documentation

## 2017-09-10 DIAGNOSIS — D259 Leiomyoma of uterus, unspecified: Secondary | ICD-10-CM

## 2017-09-10 DIAGNOSIS — Z9889 Other specified postprocedural states: Secondary | ICD-10-CM

## 2017-09-10 HISTORY — PX: ROBOTIC ASSISTED LAPAROSCOPIC HYSTERECTOMY AND SALPINGECTOMY: SHX6379

## 2017-09-10 LAB — TYPE AND SCREEN
ABO/RH(D): O POS
Antibody Screen: NEGATIVE

## 2017-09-10 SURGERY — XI ROBOTIC ASSISTED LAPAROSCOPIC HYSTERECTOMY AND SALPINGECTOMY
Anesthesia: General | Site: Abdomen | Laterality: Bilateral

## 2017-09-10 MED ORDER — FENTANYL CITRATE (PF) 250 MCG/5ML IJ SOLN
INTRAMUSCULAR | Status: AC
  Filled 2017-09-10: qty 5

## 2017-09-10 MED ORDER — LACTATED RINGERS IV SOLN
INTRAVENOUS | Status: DC
  Administered 2017-09-10 (×2): via INTRAVENOUS

## 2017-09-10 MED ORDER — FENTANYL CITRATE (PF) 100 MCG/2ML IJ SOLN
INTRAMUSCULAR | Status: DC | PRN
  Administered 2017-09-10: 50 ug via INTRAVENOUS
  Administered 2017-09-10 (×3): 25 ug via INTRAVENOUS
  Administered 2017-09-10 (×2): 50 ug via INTRAVENOUS
  Administered 2017-09-10: 25 ug via INTRAVENOUS

## 2017-09-10 MED ORDER — SCOPOLAMINE 1 MG/3DAYS TD PT72
MEDICATED_PATCH | TRANSDERMAL | Status: AC
  Filled 2017-09-10: qty 1

## 2017-09-10 MED ORDER — CEFAZOLIN SODIUM-DEXTROSE 2-4 GM/100ML-% IV SOLN
INTRAVENOUS | Status: AC
  Filled 2017-09-10: qty 100

## 2017-09-10 MED ORDER — DOCUSATE SODIUM 100 MG PO CAPS
100.0000 mg | ORAL_CAPSULE | Freq: Two times a day (BID) | ORAL | Status: DC
  Administered 2017-09-10: 100 mg via ORAL

## 2017-09-10 MED ORDER — DOCUSATE SODIUM 100 MG PO CAPS
ORAL_CAPSULE | ORAL | Status: AC
  Filled 2017-09-10: qty 1

## 2017-09-10 MED ORDER — DEXAMETHASONE SODIUM PHOSPHATE 10 MG/ML IJ SOLN
INTRAMUSCULAR | Status: AC
  Filled 2017-09-10: qty 1

## 2017-09-10 MED ORDER — OXYCODONE-ACETAMINOPHEN 5-325 MG PO TABS: 1.0000 | ORAL_TABLET | Freq: Four times a day (QID) | ORAL | 0 refills | Status: AC | PRN

## 2017-09-10 MED ORDER — LIDOCAINE 2% (20 MG/ML) 5 ML SYRINGE
INTRAMUSCULAR | Status: DC | PRN
  Administered 2017-09-10: 60 mg via INTRAVENOUS

## 2017-09-10 MED ORDER — BUPIVACAINE HCL (PF) 0.5 % IJ SOLN
INTRAMUSCULAR | Status: AC
  Filled 2017-09-10: qty 30

## 2017-09-10 MED ORDER — PANTOPRAZOLE SODIUM 40 MG PO TBEC
40.0000 mg | DELAYED_RELEASE_TABLET | Freq: Every day | ORAL | Status: DC
  Filled 2017-09-10: qty 1

## 2017-09-10 MED ORDER — ROCURONIUM BROMIDE 10 MG/ML (PF) SYRINGE
PREFILLED_SYRINGE | INTRAVENOUS | Status: DC | PRN
  Administered 2017-09-10 (×4): 10 mg via INTRAVENOUS
  Administered 2017-09-10: 40 mg via INTRAVENOUS

## 2017-09-10 MED ORDER — KETOROLAC TROMETHAMINE 30 MG/ML IJ SOLN: 30.0000 mg | Freq: Four times a day (QID) | INTRAMUSCULAR | Status: DC

## 2017-09-10 MED ORDER — KETOROLAC TROMETHAMINE 30 MG/ML IJ SOLN
INTRAMUSCULAR | Status: DC | PRN
  Administered 2017-09-10: 30 mg via INTRAVENOUS

## 2017-09-10 MED ORDER — MIDAZOLAM HCL 2 MG/2ML IJ SOLN
INTRAMUSCULAR | Status: DC | PRN
  Administered 2017-09-10: 2 mg via INTRAVENOUS

## 2017-09-10 MED ORDER — HYDROMORPHONE HCL 1 MG/ML IJ SOLN
INTRAMUSCULAR | Status: AC
  Administered 2017-09-10: 0.5 mg via INTRAVENOUS
  Filled 2017-09-10: qty 2

## 2017-09-10 MED ORDER — DOCUSATE SODIUM 100 MG PO CAPS: 100.0000 mg | ORAL_CAPSULE | Freq: Two times a day (BID) | ORAL | 2 refills | Status: AC | PRN

## 2017-09-10 MED ORDER — ZOLPIDEM TARTRATE 5 MG PO TABS: 5.0000 mg | ORAL_TABLET | Freq: Every evening | ORAL | Status: DC | PRN

## 2017-09-10 MED ORDER — OXYCODONE-ACETAMINOPHEN 5-325 MG PO TABS
ORAL_TABLET | ORAL | Status: AC
  Filled 2017-09-10: qty 1

## 2017-09-10 MED ORDER — ONDANSETRON HCL 4 MG/2ML IJ SOLN
INTRAMUSCULAR | Status: DC | PRN
  Administered 2017-09-10: 4 mg via INTRAVENOUS

## 2017-09-10 MED ORDER — CEFAZOLIN SODIUM-DEXTROSE 2-4 GM/100ML-% IV SOLN
2.0000 g | INTRAVENOUS | Status: AC
  Administered 2017-09-10: 2 g via INTRAVENOUS

## 2017-09-10 MED ORDER — PROMETHAZINE HCL 25 MG/ML IJ SOLN: 6.2500 mg | INTRAMUSCULAR | Status: DC | PRN

## 2017-09-10 MED ORDER — SODIUM CHLORIDE 0.9 % IJ SOLN
INTRAMUSCULAR | Status: AC
  Filled 2017-09-10: qty 10

## 2017-09-10 MED ORDER — SODIUM CHLORIDE 0.9 % IR SOLN
Status: DC | PRN
  Administered 2017-09-10: 3000 mL

## 2017-09-10 MED ORDER — BUPIVACAINE HCL (PF) 0.5 % IJ SOLN
INTRAMUSCULAR | Status: DC | PRN
  Administered 2017-09-10: 30 mL

## 2017-09-10 MED ORDER — ONDANSETRON HCL 4 MG/2ML IJ SOLN: 4.0000 mg | Freq: Four times a day (QID) | INTRAMUSCULAR | Status: DC | PRN

## 2017-09-10 MED ORDER — KETOROLAC TROMETHAMINE 30 MG/ML IJ SOLN
INTRAMUSCULAR | Status: AC
  Filled 2017-09-10: qty 1

## 2017-09-10 MED ORDER — PROPOFOL 10 MG/ML IV BOLUS
INTRAVENOUS | Status: DC | PRN
  Administered 2017-09-10: 180 mg via INTRAVENOUS

## 2017-09-10 MED ORDER — SOD CITRATE-CITRIC ACID 500-334 MG/5ML PO SOLN
ORAL | Status: AC
  Filled 2017-09-10: qty 15

## 2017-09-10 MED ORDER — SCOPOLAMINE 1 MG/3DAYS TD PT72
MEDICATED_PATCH | TRANSDERMAL | Status: DC | PRN
  Administered 2017-09-10: 1 via TRANSDERMAL

## 2017-09-10 MED ORDER — SUGAMMADEX SODIUM 200 MG/2ML IV SOLN
INTRAVENOUS | Status: DC | PRN
  Administered 2017-09-10: 140 mg via INTRAVENOUS

## 2017-09-10 MED ORDER — OXYCODONE-ACETAMINOPHEN 5-325 MG PO TABS
1.0000 | ORAL_TABLET | ORAL | Status: DC | PRN
  Administered 2017-09-10 (×2): 1 via ORAL

## 2017-09-10 MED ORDER — SOD CITRATE-CITRIC ACID 500-334 MG/5ML PO SOLN
30.0000 mL | ORAL | Status: AC
Start: 2017-09-10 — End: 2017-09-10
  Administered 2017-09-10: 30 mL via ORAL
  Filled 2017-09-10: qty 15

## 2017-09-10 MED ORDER — DEXTROSE-NACL 5-0.45 % IV SOLN: INTRAVENOUS | Status: DC

## 2017-09-10 MED ORDER — PROPOFOL 10 MG/ML IV BOLUS
INTRAVENOUS | Status: AC
  Filled 2017-09-10: qty 20

## 2017-09-10 MED ORDER — ONDANSETRON HCL 4 MG PO TABS
4.0000 mg | ORAL_TABLET | Freq: Four times a day (QID) | ORAL | Status: DC | PRN
  Filled 2017-09-10: qty 1

## 2017-09-10 MED ORDER — KETOROLAC TROMETHAMINE 30 MG/ML IJ SOLN
30.0000 mg | Freq: Four times a day (QID) | INTRAMUSCULAR | Status: DC
  Administered 2017-09-10: 30 mg via INTRAVENOUS

## 2017-09-10 MED ORDER — HYDROMORPHONE HCL 1 MG/ML IJ SOLN: 0.2000 mg | INTRAMUSCULAR | Status: DC | PRN

## 2017-09-10 MED ORDER — IBUPROFEN 600 MG PO TABS: 600.0000 mg | ORAL_TABLET | Freq: Four times a day (QID) | ORAL | 3 refills | Status: AC | PRN

## 2017-09-10 MED ORDER — ONDANSETRON HCL 4 MG/2ML IJ SOLN
INTRAMUSCULAR | Status: AC
  Filled 2017-09-10: qty 2

## 2017-09-10 MED ORDER — MIDAZOLAM HCL 2 MG/2ML IJ SOLN
INTRAMUSCULAR | Status: AC
  Filled 2017-09-10: qty 2

## 2017-09-10 MED ORDER — SUGAMMADEX SODIUM 200 MG/2ML IV SOLN
INTRAVENOUS | Status: AC
  Filled 2017-09-10: qty 2

## 2017-09-10 MED ORDER — PHENYLEPHRINE 40 MCG/ML (10ML) SYRINGE FOR IV PUSH (FOR BLOOD PRESSURE SUPPORT)
PREFILLED_SYRINGE | INTRAVENOUS | Status: DC | PRN
  Administered 2017-09-10 (×2): 80 ug via INTRAVENOUS

## 2017-09-10 MED ORDER — OXYCODONE-ACETAMINOPHEN 5-325 MG PO TABS: 2.0000 | ORAL_TABLET | ORAL | Status: DC | PRN

## 2017-09-10 MED ORDER — DEXAMETHASONE SODIUM PHOSPHATE 10 MG/ML IJ SOLN
INTRAMUSCULAR | Status: DC | PRN
  Administered 2017-09-10: 10 mg via INTRAVENOUS

## 2017-09-10 MED ORDER — SODIUM CHLORIDE 0.9 % IJ SOLN
INTRAMUSCULAR | Status: DC | PRN
  Administered 2017-09-10: 10 mL

## 2017-09-10 MED ORDER — ROCURONIUM BROMIDE 10 MG/ML (PF) SYRINGE
PREFILLED_SYRINGE | INTRAVENOUS | Status: AC
  Filled 2017-09-10: qty 5

## 2017-09-10 MED ORDER — SIMETHICONE 80 MG PO CHEW: 80.0000 mg | CHEWABLE_TABLET | Freq: Four times a day (QID) | ORAL | Status: DC | PRN

## 2017-09-10 MED ORDER — LIDOCAINE 2% (20 MG/ML) 5 ML SYRINGE
INTRAMUSCULAR | Status: AC
  Filled 2017-09-10: qty 5

## 2017-09-10 MED ORDER — HYDROMORPHONE HCL 1 MG/ML IJ SOLN
0.2500 mg | INTRAMUSCULAR | Status: DC | PRN
  Administered 2017-09-10 (×4): 0.5 mg via INTRAVENOUS

## 2017-09-10 MED ORDER — PHENYLEPHRINE 40 MCG/ML (10ML) SYRINGE FOR IV PUSH (FOR BLOOD PRESSURE SUPPORT)
PREFILLED_SYRINGE | INTRAVENOUS | Status: AC
  Filled 2017-09-10: qty 10

## 2017-09-10 SURGICAL SUPPLY — 75 items
ADH SKN CLS APL DERMABOND .7 (GAUZE/BANDAGES/DRESSINGS) ×1
APL SRG 38 LTWT LNG FL B (MISCELLANEOUS)
APPLICATOR ARISTA FLEXITIP XL (MISCELLANEOUS) IMPLANT
APPLIER CLIP 5 13 M/L LIGAMAX5 (MISCELLANEOUS)
APR CLP MED LRG 5 ANG JAW (MISCELLANEOUS)
BARRIER ADHS 3X4 INTERCEED (GAUZE/BANDAGES/DRESSINGS) IMPLANT
BRR ADH 4X3 ABS CNTRL BYND (GAUZE/BANDAGES/DRESSINGS)
CANISTER SUCT 3000ML PPV (MISCELLANEOUS) ×3 IMPLANT
CATH FOLEY 3WAY  5CC 16FR (CATHETERS) ×2
CATH FOLEY 3WAY 5CC 16FR (CATHETERS) ×1 IMPLANT
CLIP APPLIE 5 13 M/L LIGAMAX5 (MISCELLANEOUS) IMPLANT
COVER BACK TABLE 60X90IN (DRAPES) ×3 IMPLANT
COVER TIP SHEARS 8 DVNC (MISCELLANEOUS) ×1 IMPLANT
COVER TIP SHEARS 8MM DA VINCI (MISCELLANEOUS) ×2
DECANTER SPIKE VIAL GLASS SM (MISCELLANEOUS) ×6 IMPLANT
DEFOGGER SCOPE WARMER CLEARIFY (MISCELLANEOUS) ×3 IMPLANT
DERMABOND ADVANCED (GAUZE/BANDAGES/DRESSINGS) ×2
DERMABOND ADVANCED .7 DNX12 (GAUZE/BANDAGES/DRESSINGS) ×1 IMPLANT
DRAPE ARM DVNC X/XI (DISPOSABLE) ×3 IMPLANT
DRAPE COLUMN DVNC XI (DISPOSABLE) ×1 IMPLANT
DRAPE DA VINCI XI ARM (DISPOSABLE) ×6
DRAPE DA VINCI XI COLUMN (DISPOSABLE) ×2
DURAPREP 26ML APPLICATOR (WOUND CARE) ×3 IMPLANT
ELECT REM PT RETURN 15FT ADLT (MISCELLANEOUS) ×3 IMPLANT
GAUZE PETROLATUM 1 X8 (GAUZE/BANDAGES/DRESSINGS) ×3 IMPLANT
GLOVE BIO SURGEON STRL SZ7 (GLOVE) ×6 IMPLANT
GLOVE BIOGEL PI IND STRL 7.0 (GLOVE) ×5 IMPLANT
GLOVE BIOGEL PI INDICATOR 7.0 (GLOVE) ×10
GOWN STRL REUS W/TWL XL LVL3 (GOWN DISPOSABLE) ×3 IMPLANT
GYRUS RUMI II 2.5CM BLUE (DISPOSABLE)
GYRUS RUMI II 3.5CM BLUE (DISPOSABLE)
GYRUS RUMI II 4.0CM BLUE (DISPOSABLE)
HEMOSTAT ARISTA ABSORB 3G PWDR (MISCELLANEOUS) IMPLANT
IRRIG SUCT STRYKERFLOW 2 WTIP (MISCELLANEOUS) ×3
IRRIGATION SUCT STRKRFLW 2 WTP (MISCELLANEOUS) ×1 IMPLANT
LEGGING LITHOTOMY PAIR STRL (DRAPES) ×3 IMPLANT
NEEDLE INSUFFLATION 120MM (ENDOMECHANICALS) ×3 IMPLANT
OBTURATOR OPTICAL STANDARD 8MM (TROCAR)
OBTURATOR OPTICAL STND 8 DVNC (TROCAR)
OBTURATOR OPTICALSTD 8 DVNC (TROCAR) IMPLANT
OCCLUDER COLPOPNEUMO (BALLOONS) ×3 IMPLANT
PACK ROBOT WH (CUSTOM PROCEDURE TRAY) ×3 IMPLANT
PACK ROBOTIC GOWN (GOWN DISPOSABLE) ×3 IMPLANT
PACK TRENDGUARD 450 HYBRID PRO (MISCELLANEOUS) ×1 IMPLANT
PACK TRENDGUARD 600 HYBRD PROC (MISCELLANEOUS) IMPLANT
PAD PREP 24X48 CUFFED NSTRL (MISCELLANEOUS) ×3 IMPLANT
RUMI II 3.0CM BLUE KOH-EFFICIE (DISPOSABLE) ×3 IMPLANT
RUMI II GYRUS 2.5CM BLUE (DISPOSABLE) IMPLANT
RUMI II GYRUS 3.5CM BLUE (DISPOSABLE) IMPLANT
RUMI II GYRUS 4.0CM BLUE (DISPOSABLE) IMPLANT
SEAL CANN UNIV 5-8 DVNC XI (MISCELLANEOUS) ×3 IMPLANT
SEAL XI 5MM-8MM UNIVERSAL (MISCELLANEOUS) ×6
SEALER VESSEL DA VINCI XI (MISCELLANEOUS) ×2
SEALER VESSEL EXT DVNC XI (MISCELLANEOUS) ×1 IMPLANT
SET CYSTO W/LG BORE CLAMP LF (SET/KITS/TRAYS/PACK) IMPLANT
SET TRI-LUMEN FLTR TB AIRSEAL (TUBING) ×3 IMPLANT
SPONGE LAP 18X18 X RAY DECT (DISPOSABLE) ×3 IMPLANT
SUT VIC AB 0 CT1 27 (SUTURE) ×6
SUT VIC AB 0 CT1 27XBRD ANBCTR (SUTURE) ×2 IMPLANT
SUT VICRYL 0 UR6 27IN ABS (SUTURE) ×6 IMPLANT
SUT VICRYL 4-0 PS2 18IN ABS (SUTURE) ×6 IMPLANT
SUT VLOC 180 0 9IN  GS21 (SUTURE) ×2
SUT VLOC 180 0 9IN GS21 (SUTURE) ×1 IMPLANT
SYSTEM CARTER THOMASON II (TROCAR) IMPLANT
TIP RUMI ORANGE 6.7MMX12CM (TIP) IMPLANT
TIP UTERINE 5.1X6CM LAV DISP (MISCELLANEOUS) IMPLANT
TIP UTERINE 6.7X10CM GRN DISP (MISCELLANEOUS) IMPLANT
TIP UTERINE 6.7X6CM WHT DISP (MISCELLANEOUS) IMPLANT
TIP UTERINE 6.7X8CM BLUE DISP (MISCELLANEOUS) ×3 IMPLANT
TOWEL OR 17X24 6PK STRL BLUE (TOWEL DISPOSABLE) ×6 IMPLANT
TRENDGUARD 450 HYBRID PRO PACK (MISCELLANEOUS) ×3
TRENDGUARD 600 HYBRID PROC PK (MISCELLANEOUS)
TROCAR PORT AIRSEAL 5X120 (TROCAR) ×6 IMPLANT
TUBING INSUFFLATION (TUBING) ×2 IMPLANT
WATER STERILE IRR 1000ML POUR (IV SOLUTION) ×3 IMPLANT

## 2017-09-10 NOTE — Op Note (Signed)
09/10/2017  12:53 PM  PATIENT:  Kristin Johnston  52 y.o. female  PRE-OPERATIVE DIAGNOSIS:  ABNORMAL UTERINE BLEEDING WITH CHRONIC ANEMIA FIBROIDS  POST-OPERATIVE DIAGNOSIS:  ABNORMAL UTERINE BLEEDING WITH CHRONIC ANEMIA  PROCEDURE:  Procedure(s): XI ROBOTIC ASSISTED LAPAROSCOPIC HYSTERECTOMY AND SALPINGECTOMY (Bilateral)  SURGEON:  Surgeon(s) and Role:    * Lavonia Drafts, MD - Primary    * Constant, Peggy, MD - Assisting  ANESTHESIA:   general  EBL:  50 mL   BLOOD ADMINISTERED:none  DRAINS: none   LOCAL MEDICATIONS USED:  MARCAINE     SPECIMEN:  Source of Specimen:  uterus, fallopian tubes and cervix  DISPOSITION OF SPECIMEN:  PATHOLOGY  COUNTS:  YES  TOURNIQUET:  * No tourniquets in log *  DICTATION: .Note written in EPIC  PLAN OF CARE: obervation with prolonged recovery and discharge to home this evening.   PATIENT DISPOSITION:  PACU - hemodynamically stable.   Delay start of Pharmacological VTE agent (>24hrs) due to surgical blood loss or risk of bleeding: not applicable  Complication: none immediate  Indications: 52 yo AA female with AUB and fibroids.   The risks, benefits, and alternatives of surgery were explained, understood, and accepted. Consents were signed. All questions were answered. She was taken to the operating room and general anesthesia was applied without complication. She was placed in the dorsal lithotomy position and her abdomen and vagina were prepped and draped after she had been carefully positioned on the table. A bimanual exam revealed a 15 week size uterus that was mobile. Her adnexa were not enlarged. The cervix was measured and the uterus was sounded to 10.5 cm. A Rumi uterine manipulator was placed without difficulty. A Foley catheter was placed and it drained clear throughout the case. Gloves were changed and attention was turned to the abdomen. A 76mm incision was made in the umbilicus and a Veress needle was placed  intraperitoneally. CO2 was used to insufflate the abdomen to approximately 4 L. After good pneumoperitoneum was established, a 8 mm trocar was placed in the umbilicus.  Laparoscopy confirmed correct placement. She was placed in Trendelenburg position and ports were placed in appropriate positions on her abdomen to allow maximum exposure during the robotic case. Specifically there was an 11mm assistant port placed in the left lower quadrant under direct laproscopic visualization. Two 8 mm ports were placed 8cm lateral to the midline port.  These were all placed under direct laparoscopic visualization. The robot was docked and I proceeded with a robotic portion of the case.  The pelvis was inspected and the uterus was found to have fibroids and be enlarged.  The fallopian tubes and ovaries were found to be normal. The remainder of her pelvis appeared normal with the exception of adhesions of bowel to the adnexa and sidewall on the left side.  These were released sharply. The ureters and the infundibulopelvic ligaments were identified. I excised the fallopian tubes bilaterally. The round ligament on each side was cauterized and cut. The PK/gyrus instrument was used for this portion. The round ligaments were identified, cauterized and ligated, a bladder flap was created anteriorly. The uterine vessels were identified and cauterized and then cut.The bladder was pushed out of the operative site and an anterior colpotomy was made. The colpotomy incision was extended circumferentially, following the blue outline of the Rumi manipulator. As the colpotomy was performed the [employer] stopped working and >45 min were taken to troubleshoot why it stopped working.  Once working the case proceeded  as usual.   All pedicles were hemostatic.  The uterus was removed from the vagina with the fallopian tube segments. The vaginal cuff was closed with v-lock suture.  Excellent hemostasis was noted throughout. The pelvis was irrigated. The  intraabdominal pressure was lowered assess hemostasis. After determining excellent hemostasis, the robot was undocked.The cystoscopy revealed blue ejection from both ureters. The skin from all of the other ports was closed with 4-0 vicryl. 30cc of 0.5% Marcaine was injected into the port sites.  The patient was then extubated and taken to recovery in stable condition.   Sponge, lap and needle counts were correct x 2.  The pt will have 6 hours in recovery then will be discharged to home.    Gyanna Jarema L. Harraway-Smith, M.D., [facility]

## 2017-09-10 NOTE — Progress Notes (Signed)
09/10/2017 Verbal telephone order Dr. Ihor Dow, ok to d/c pt. Home with follow up in 2 weeks. Pt. Updated on plan of care. Verbalized understanding. Scopolamine patch removal instructions reviewed as well as discharge instructions. D/c home per orders.  Kegan Shepardson, Arville Lime

## 2017-09-10 NOTE — H&P (Signed)
Preoperative History and Physical  Kristin Johnston is a 52 y.o. G1P1001 here for surgical management of AUB and fibroids.   Proposed surgery: RATH with bilateral salpingectomy  Past Medical History:  Diagnosis Date  . Anemia   . H/O seasonal allergies   . Heart murmur    as child- no problems   Past Surgical History:  Procedure Laterality Date  . COLONOSCOPY WITH PROPOFOL N/A 04/23/2016   Procedure: COLONOSCOPY WITH PROPOFOL;  Surgeon: Garlan Fair, MD;  Location: [facility] ENDOSCOPY;  Service: Endoscopy;  Laterality: N/A;  . DILATION AND CURETTAGE OF UTERUS    . GANGLION CYST EXCISION     left wrist  . TUBAL LIGATION     OB History    Gravida Para Term Preterm AB Living   1 1 1     1    SAB TAB Ectopic Multiple Live Births           1     Patient denies any cervical dysplasia or STIs. Medications Prior to Admission  Medication Sig Dispense Refill Last Dose  . Fe Fum-FePoly-FA-Vit C-Vit B3 (FOLIVANE-F PO) Take 1 tablet by mouth daily.   09/03/2017  . mometasone (NASONEX) 50 MCG/ACT nasal spray Place 2 sprays into the nose every other day.    09/06/2017  . Fe Fum-FePoly-FA-Vit C-Vit B3 (INTEGRA F) 125-1 MG CAPS Take 1 capsule by mouth daily. (Patient not taking: Reported on 08/23/2017) 30 capsule 3 Not Taking at Unknown time    No Known Allergies Social History:   reports that  has never smoked. she has never used smokeless tobacco. She reports that she drinks alcohol. She reports that she does not use drugs. History reviewed. No pertinent family history.  Review of Systems: Noncontributory  PHYSICAL EXAM: Height 5\' 3"  (1.6 m), weight 152 lb (68.9 kg), last menstrual period 08/20/2017. General appearance - alert, well appearing, and in no distress Chest - clear to auscultation, no wheezes, rales or rhonchi, symmetric air entry Heart - normal rate and regular rhythm Abdomen - soft, nontender, nondistended, no masses or organomegaly Pelvic - examination not  indicated Extremities - peripheral pulses normal, no pedal edema, no clubbing or cyanosis  Labs: Results for orders placed or performed during the hospital encounter of 09/02/17 (from the past 336 hour(s))  ABO/Rh   Collection Time: 09/02/17  1:46 PM  Result Value Ref Range   ABO/RH(D)      O POS Performed at [facility], [place] [address], [place], [place] [address]   Basic metabolic panel   Collection Time: 09/02/17  1:49 PM  Result Value Ref Range   Sodium 139 135 - 145 mmol/L   Potassium 3.8 3.5 - 5.1 mmol/L   Chloride 108 101 - 111 mmol/L   CO2 22 22 - 32 mmol/L   Glucose, Bld 105 (H) 65 - 99 mg/dL   BUN 7 6 - 20 mg/dL   Creatinine, Ser 0.67 0.44 - 1.00 mg/dL   Calcium 8.7 (L) 8.9 - 10.3 mg/dL   GFR calc non Af Amer >60 >60 mL/min   GFR calc Af Amer >60 >60 mL/min   Anion gap 9 5 - 15  CBC   Collection Time: 09/02/17  1:49 PM  Result Value Ref Range   WBC 4.7 4.0 - 10.5 K/uL   RBC 4.32 3.87 - 5.11 MIL/uL   Hemoglobin 11.0 (L) 12.0 - 15.0 g/dL   HCT 34.1 (L) 36.0 - 46.0 %   MCV 78.9 78.0 -  100.0 fL   MCH 25.5 (L) 26.0 - 34.0 pg   MCHC 32.3 30.0 - 36.0 g/dL   RDW 15.6 (H) 11.5 - 15.5 %   Platelets 339 150 - 400 K/uL  Pregnancy, urine   Collection Time: 09/02/17  1:49 PM  Result Value Ref Range   Preg Test, Ur NEGATIVE NEGATIVE  Type and screen [place]   Collection Time: 09/02/17  1:49 PM  Result Value Ref Range   ABO/RH(D) O POS    Antibody Screen NEG    Sample Expiration 09/13/2017    Extend sample reason      NO TRANSFUSIONS OR PREGNANCY IN THE PAST 3 MONTHS Performed at [facility], [place] [address], [place], [place] [address]     Imaging Studies: Mm Diag Breast Tomo Uni Right  Result Date: 09/09/2017 CLINICAL DATA:  52 year old patient presents for six-month follow-up for probably benign asymmetric fibroglandular tissue in the posterior third of the upper right breast seen only on the MLO view.  EXAM: DIGITAL DIAGNOSTIC UNILATERAL RIGHT MAMMOGRAM WITH CAD AND TOMO COMPARISON:  March 25, 2017, March 18, 2017, March 15, 2016, February 28, 2015, March 23, 2013, March 05, 2011, March 01, 2009 ACR Breast Density Category b: There are scattered areas of fibroglandular density. FINDINGS: Parenchymal pattern of the right breast is stable. Fibroglandular tissue in the superior right breast, projecting over the pectoralis muscle in the MLO view, is mammographically stable. No mass, architectural distortion, or suspicious microcalcification is identified in the right breast to suggest malignancy. Mammographic images were processed with CAD. IMPRESSION: Stable probably benign asymmetric fibroglandular tissue in the deep upper right breast. No new or suspicious findings in the right breast. RECOMMENDATION: Bilateral diagnostic mammogram is recommended in September 2019 to complete a 1 year follow-up. I have discussed the findings and recommendations with the patient. Results were also provided in writing at the conclusion of the visit. If applicable, a reminder letter will be sent to the patient regarding the next appointment. BI-RADS CATEGORY  3: Probably benign. Electronically Signed   By: Curlene Dolphin M.D.   On: 09/09/2017 10:29    Assessment: Patient Active Problem List   Diagnosis Date Noted  . Abnormal uterine bleeding (AUB) 08/15/2017  . Fibroid uterus 08/15/2017    Plan: Patient will undergo surgical management with Robot assisted total laparoscopic hysterectomy with bilateral salpingectomy..   The risks of surgery were discussed in detail with the patient including but not limited to: bleeding which may require transfusion or reoperation; infection which may require antibiotics; injury to surrounding organs which may involve bowel, bladder, ureters ; need for additional procedures including laparoscopy or laparotomy; thromboembolic phenomenon, surgical site problems and other  postoperative/anesthesia complications. Likelihood of success in alleviating the patient's condition was discussed. Routine postoperative instructions will be reviewed with the patient and her family in detail after surgery.  The patient concurred with the proposed plan, giving informed written consent for the surgery.  Patient has been NPO since last night she will remain NPO for procedure.  Anesthesia and OR aware.  Preoperative prophylactic antibiotics and SCDs ordered on call to the OR.  To OR when ready.  Jazzmyne Rasnick L. Ihor Dow, M.D., [facility] 09/10/2017 7:11 AM

## 2017-09-10 NOTE — Brief Op Note (Signed)
09/10/2017  12:53 PM  PATIENT:  Kristin Johnston  52 y.o. female  PRE-OPERATIVE DIAGNOSIS:  ABNORMAL UTERINE BLEEDING WITH CHRONIC ANEMIA FIBROIDS  POST-OPERATIVE DIAGNOSIS:  ABNORMAL UTERINE BLEEDING WITH CHRONIC ANEMIA  PROCEDURE:  Procedure(s): XI ROBOTIC ASSISTED LAPAROSCOPIC HYSTERECTOMY AND SALPINGECTOMY (Bilateral)  SURGEON:  Surgeon(s) and Role:    * Lavonia Drafts, MD - Primary    * Constant, Peggy, MD - Assisting  ANESTHESIA:   general  EBL:  50 mL   BLOOD ADMINISTERED:none  DRAINS: none   LOCAL MEDICATIONS USED:  MARCAINE     SPECIMEN:  Source of Specimen:  uterus, fallopian tubes and cervix  DISPOSITION OF SPECIMEN:  PATHOLOGY  COUNTS:  YES  TOURNIQUET:  * No tourniquets in log *  DICTATION: .Note written in EPIC  PLAN OF CARE: obervation with prolonged recovery and discharge to home this evening.   PATIENT DISPOSITION:  PACU - hemodynamically stable.   Delay start of Pharmacological VTE agent (>24hrs) due to surgical blood loss or risk of bleeding: not applicable  Complication: none immediate  Cinthia Rodden L. Harraway-Smith, M.D., [facility]

## 2017-09-10 NOTE — Anesthesia Procedure Notes (Signed)
Procedure Name: Intubation Date/Time: 09/10/2017 7:31 AM Performed by: Suan Halter, CRNA Pre-anesthesia Checklist: Patient identified, Emergency Drugs available, Suction available and Patient being monitored Patient Re-evaluated:Patient Re-evaluated prior to induction Oxygen Delivery Method: Circle system utilized Preoxygenation: Pre-oxygenation with 100% oxygen Induction Type: IV induction Ventilation: Mask ventilation without difficulty Laryngoscope Size: Mac and 3 Grade View: Grade I Tube type: Oral Number of attempts: 1 Airway Equipment and Method: Stylet and Oral airway Placement Confirmation: ETT inserted through vocal cords under direct vision,  positive ETCO2 and breath sounds checked- equal and bilateral Secured at: 22 cm Tube secured with: Tape Dental Injury: Teeth and Oropharynx as per pre-operative assessment

## 2017-09-10 NOTE — Anesthesia Postprocedure Evaluation (Signed)
Anesthesia Post Note  Patient: Kristin Johnston  Procedure(s) Performed: XI ROBOTIC ASSISTED LAPAROSCOPIC HYSTERECTOMY AND SALPINGECTOMY (Bilateral Abdomen)     Patient location during evaluation: PACU Anesthesia Type: General Level of consciousness: awake and alert Pain management: pain level controlled Vital Signs Assessment: post-procedure vital signs reviewed and stable Respiratory status: spontaneous breathing, nonlabored ventilation, respiratory function stable and patient connected to nasal cannula oxygen Cardiovascular status: blood pressure returned to baseline and stable Postop Assessment: no apparent nausea or vomiting Anesthetic complications: no    Last Vitals:  Vitals:   09/10/17 1145 09/10/17 1206  BP: (!) 144/90 (!) 147/99  Pulse: 71 77  Resp: 13 14  Temp: 37.3 C 36.6 C  SpO2: 100% 100%    Last Pain:  Vitals:   09/10/17 1145  TempSrc:   PainSc: Tyler Deis

## 2017-09-10 NOTE — Transfer of Care (Signed)
   Last Vitals:  Vitals:   09/10/17 0545  BP: (!) 167/96  Pulse: 66  Resp: 16  Temp: 36.8 C  SpO2: 100%    Last Pain:  Vitals:   09/10/17 0545  TempSrc: Oral      Patients Stated Pain Goal: 4 (09/10/17 0604)

## 2017-09-11 ENCOUNTER — Encounter (HOSPITAL_COMMUNITY): Payer: Self-pay | Admitting: Obstetrics & Gynecology

## 2017-09-12 ENCOUNTER — Telehealth: Payer: Self-pay | Admitting: Obstetrics & Gynecology

## 2017-09-12 NOTE — Telephone Encounter (Signed)
TC to pt to see how she's doing post op. Pt is without complaints. No BM yet. Rec taking the colace and decreasing the narc use. She is walking and her pain is controlled with her meds. All questions answered. Pt will f/u in 2 weeks or sooner prn  Natale Barba L. Harraway-Smith, M.D., Cherlynn June

## 2017-10-02 ENCOUNTER — Ambulatory Visit (INDEPENDENT_AMBULATORY_CARE_PROVIDER_SITE_OTHER): Payer: BC Managed Care – PPO | Admitting: Obstetrics & Gynecology

## 2017-10-02 ENCOUNTER — Encounter: Payer: Self-pay | Admitting: Obstetrics & Gynecology

## 2017-10-02 VITALS — BP 129/95 | HR 83 | Wt 154.0 lb

## 2017-10-02 DIAGNOSIS — Z90710 Acquired absence of both cervix and uterus: Secondary | ICD-10-CM

## 2017-10-02 DIAGNOSIS — Z09 Encounter for follow-up examination after completed treatment for conditions other than malignant neoplasm: Secondary | ICD-10-CM

## 2017-10-02 NOTE — Patient Instructions (Signed)
Return to clinic for any scheduled appointments or for any gynecologic concerns as needed.   

## 2017-10-02 NOTE — Progress Notes (Signed)
Patient is post op Robotic assisted Hysterectomy. Patient has no complaints at this time. Kathrene Alu RNBSN

## 2017-10-02 NOTE — Progress Notes (Signed)
Subjective:     Kristin Johnston is a 52 y.o. G87P1001 female who presents to the clinic 3 weeks status post robotic assisted laparoscopic hysterectomy and bilateral salpingectomy for abnormal uterine bleeding and fibroids. Eating a regular diet without difficulty. Bowel movements are mostly normal, mild pain but no constipation . The patient is having mild postsurgical abdominal pain.  The following portions of the patient's history were reviewed and updated as appropriate: allergies, current medications, past family history, past medical history, past social history, past surgical history and problem list.  Review of Systems Pertinent items noted in HPI and remainder of comprehensive ROS otherwise negative.    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 - BENIGN UNREMARKABLE CERVIX. - BENIGN UNREMARKABLE ENDOMETRIUM. - UNREMARKABLE BILATERAL FALLOPIAN TUBES.  Assessment:    Doing well postoperatively. Operative findings again reviewed. Pathology report discussed.    Plan:   1. Continue any current medications. 2. Wound care discussed. 3. Activity restrictions: pelvic rest for 8 weeks after hysterectomy 4. Anticipated return to work: next month  Return in about 3 weeks (around 10/23/2017) for Postoperative exam with Dr. Ihor Dow.   Verita Schneiders, MD, [place] [facility] [employer], [place]

## 2017-10-23 ENCOUNTER — Ambulatory Visit (INDEPENDENT_AMBULATORY_CARE_PROVIDER_SITE_OTHER): Payer: BC Managed Care – PPO | Admitting: Obstetrics & Gynecology

## 2017-10-23 ENCOUNTER — Encounter: Payer: Self-pay | Admitting: Obstetrics & Gynecology

## 2017-10-23 VITALS — BP 161/94 | HR 73 | Ht 63.0 in | Wt 154.0 lb

## 2017-10-23 DIAGNOSIS — D219 Benign neoplasm of connective and other soft tissue, unspecified: Secondary | ICD-10-CM

## 2017-10-23 DIAGNOSIS — Z9889 Other specified postprocedural states: Secondary | ICD-10-CM

## 2017-10-23 NOTE — Progress Notes (Signed)
History:  52 y.o. G1P1001 here today for 6 week post op check. Pt is doing well. She has gradually been increasing her activities. She is voiding and passing stools without difficulty.   She denies pain at all and no vagina bleeding. No vaginal bleeding.   The following portions of the patient's history were reviewed and updated as appropriate: allergies, current medications, past family history, past medical history, past social history, past surgical history and problem list.  Review of Systems:  Pertinent items are noted in HPI.    Objective:  Physical Exam Blood pressure (!) 161/94, pulse 73, height 5\' 3"  (1.6 m), weight 154 lb (69.9 kg).  CONSTITUTIONAL: Well-developed, well-nourished female in no acute distress.  HENT:  Normocephalic, atraumatic EYES: Conjunctivae and EOM are normal. No scleral icterus.  NECK: Normal range of motion SKIN: Skin is warm and dry. No rash noted. Not diaphoretic.No pallor. [place]: Alert and oriented to person, place, and time. Normal coordination.  Abd: Soft, nontender and nondistended; post sites well healed Pelvic: Normal appearing external genitalia; normal appearing vaginal mucosa; the vaginal cuff is well healed. .  Normal discharge.  No palpable masses or adnexal tenderness.  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 - BENIGN UNREMARKABLE CERVIX. - BENIGN UNREMARKABLE ENDOMETRIUM. - UNREMARKABLE BILATERAL FALLOPIAN TUBES.  Assessment & Plan:  6 week post op check following a RATH with bilateral salpingectomy  May RTW in 5 days  Care returned to Dr. Worthy Flank    No intercourse for another 2 weeks  Gradual return to full activity  Mariah Harn L. Harraway-Smith, M.D., Cherlynn June

## 2017-10-23 NOTE — Patient Instructions (Signed)

## 2017-10-24 ENCOUNTER — Telehealth: Payer: Self-pay

## 2017-10-24 NOTE — Telephone Encounter (Signed)
Patient scheduled for BP nurse check. Kathrene Alu RN

## 2017-10-24 NOTE — Telephone Encounter (Signed)
Left message for patient to return call to office to schedule a blood pressure check. Kathrene Alu RN

## 2017-10-24 NOTE — Telephone Encounter (Signed)
-----   Message from Lavonia Drafts, MD sent at 10/23/2017  1:41 PM EDT ----- Please call pt. She had an elevated BP and needs to have her BP rechecked in 2 weeks. Any provider.   Thx, clh-S

## 2017-10-31 IMAGING — MG 2D DIGITAL DIAGNOSTIC UNILATERAL RIGHT MAMMOGRAM WITH CAD AND AD
6 series · 6 of 14 positions shown · non-contrast
Comparison: Previous exam(s).

CLINICAL DATA: Left superior breast asymmetry seen on most recent
screening mammography.

EXAM:
2D DIGITAL DIAGNOSTIC RIGHT MAMMOGRAM WITH CAD AND ADJUNCT TOMO
ULTRASOUND RIGHT BREAST

[R MLO synth-2D]
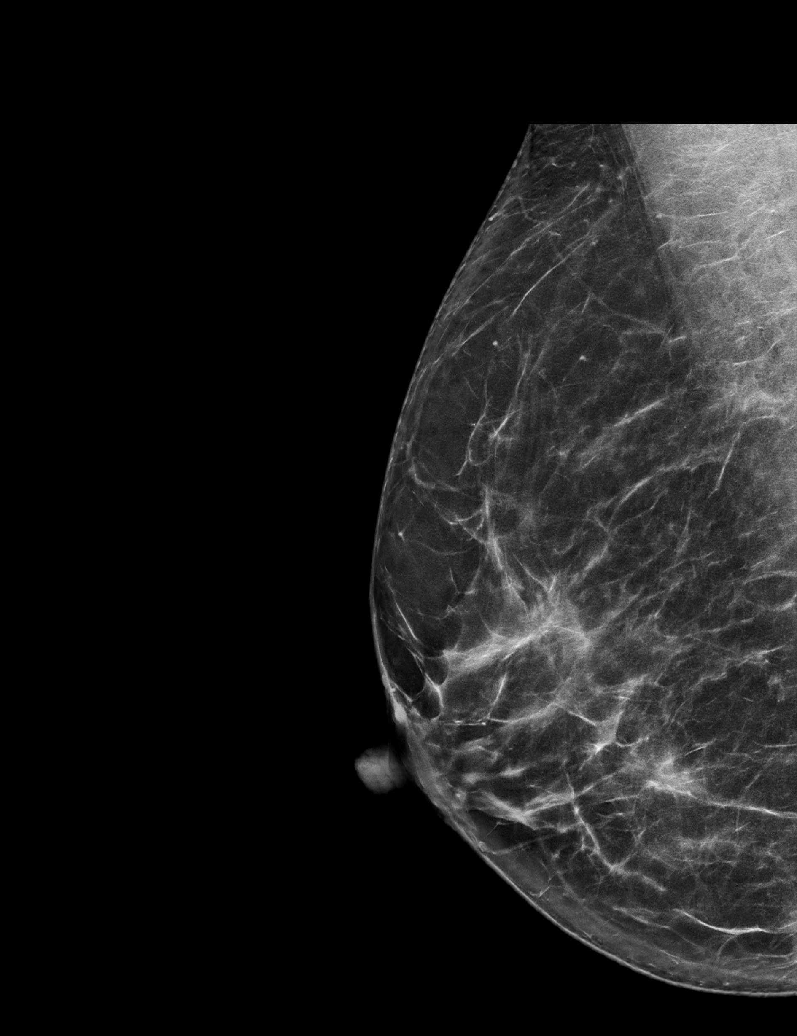

[R CC synth-2D]
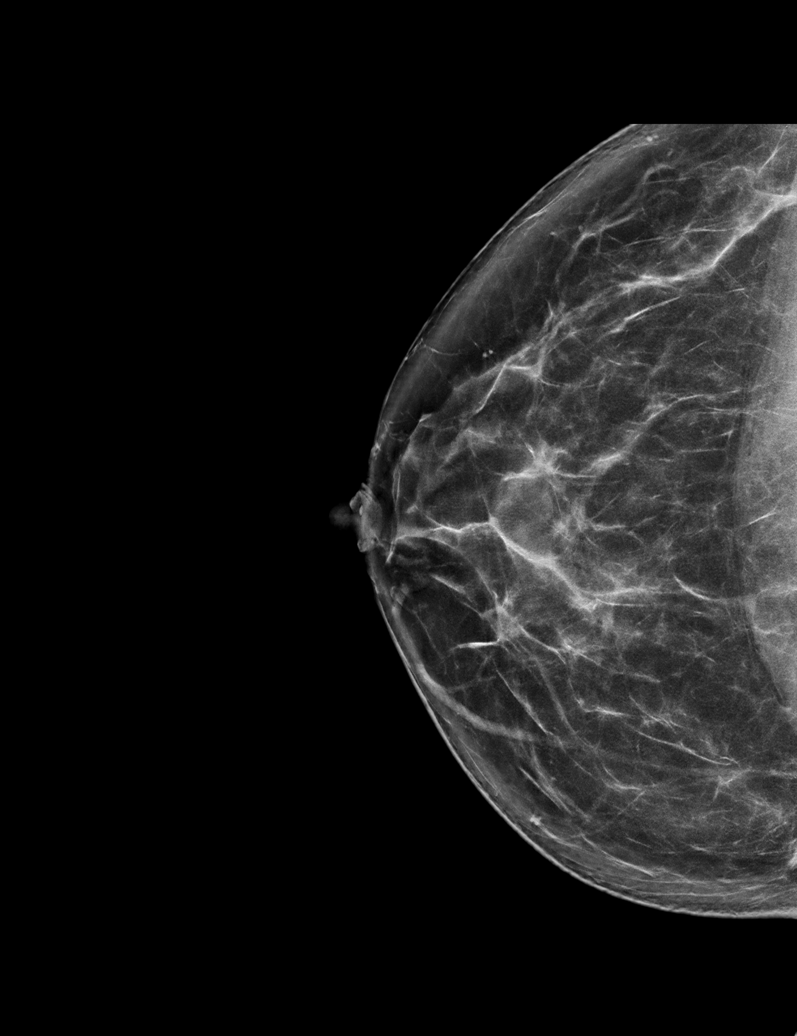

[R CC]
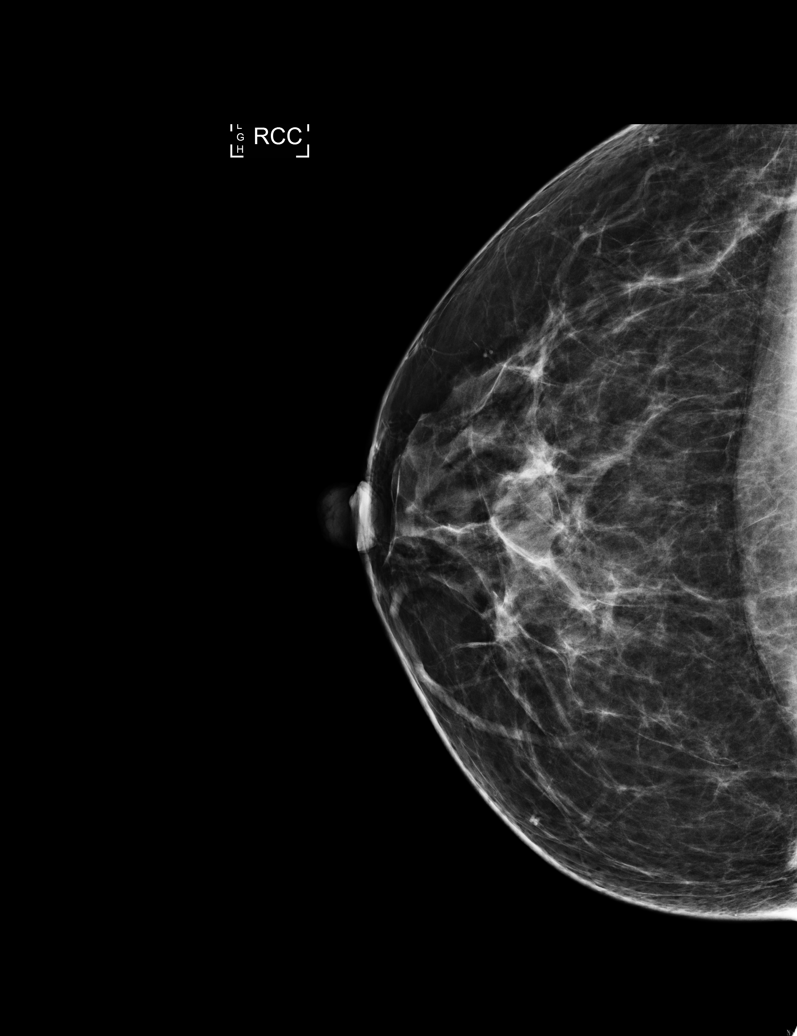

[R MLO]
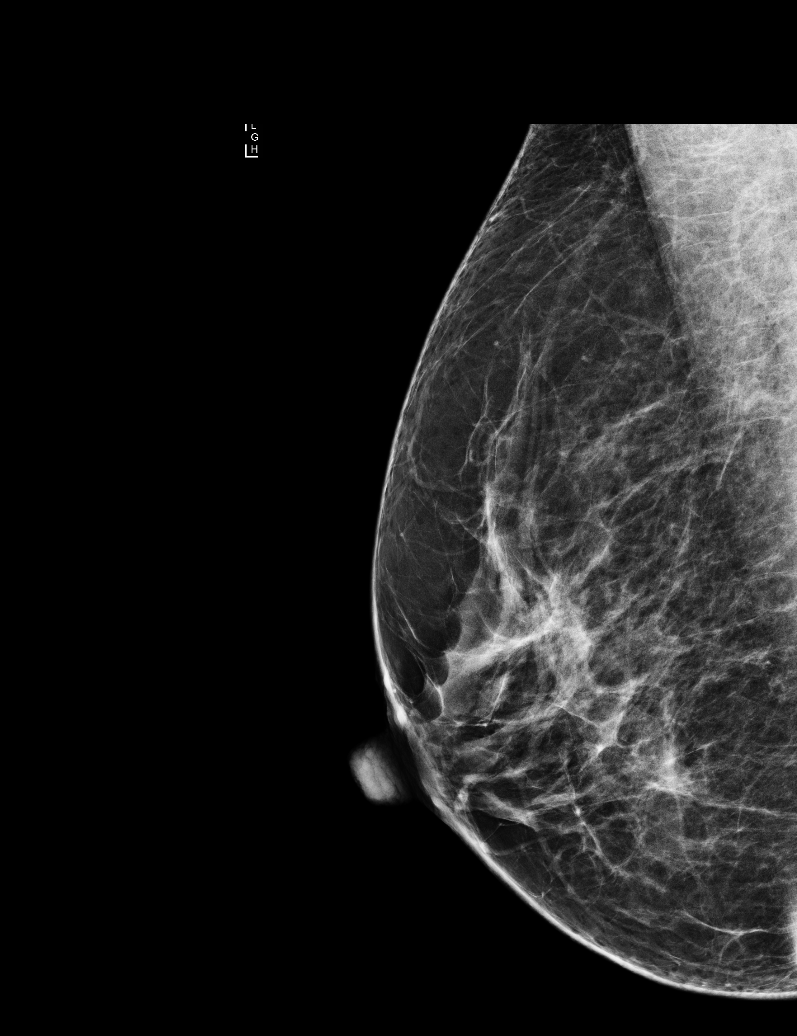

[R CC tomo · tomo slice 39/76.0]
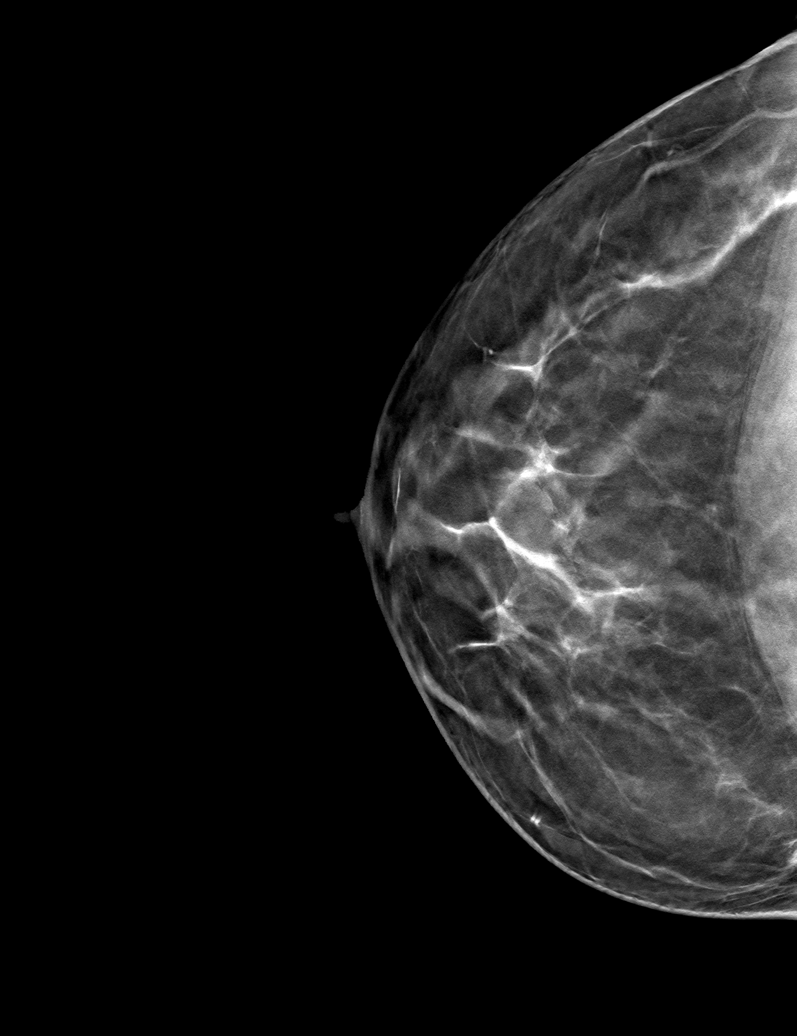

[R MLO tomo · tomo slice 35/70.0]
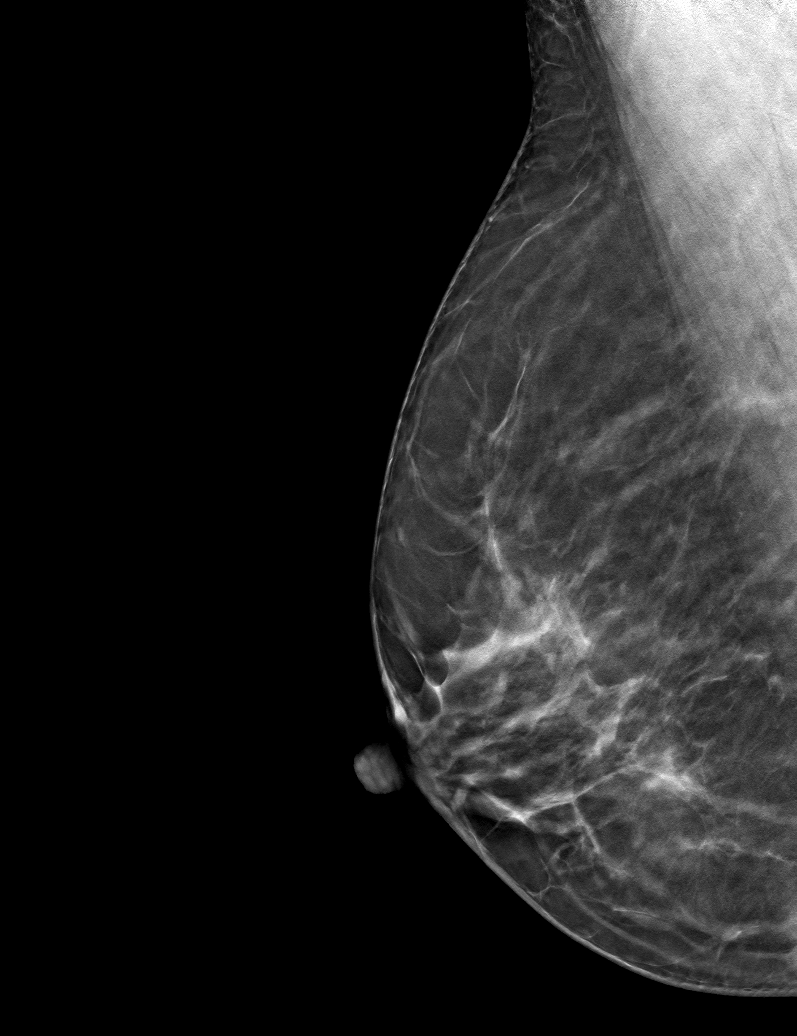

[6 of 14 positions shown; findings below may reference images not displayed]

ACR Breast Density Category b: There are scattered areas of
fibroglandular density.
FINDINGS: Mammographically, there is a subtle non mass appearing asymmetry in
the right upper breast, posterior depth, which according to
tomosynthesis images should be located in the upper inner quadrant,
far posterior depth. The finding may be best seen on image 24/70,
MLO tomosynthesis images. Careful review of patient's prior
mammograms demonstrate similar appearing non mass asymmetry on
several prior mammograms, which supports probably benign etiology
for this finding.

Mammographic images were processed with CAD.

On physical exam, no suspicious masses are palpated.

Targeted ultrasound is performed, showing no suspicious masses or
shadowing lesions in the superior right breast.
IMPRESSION: Subtle non mass one view asymmetry in the superior right breast,
without sonographic correlation. Probably benign finding, for which
short-term follow-up is recommended.

RECOMMENDATION:
Diagnostic mammogram and possibly ultrasound of the right breast in
6 months. (Code:[id number]-[id number])

I have discussed the findings and recommendations with the patient.
Results were also provided in writing at the conclusion of the
visit. If applicable, a reminder letter will be sent to the patient
regarding the next appointment.

BI-RADS CATEGORY  3: Probably benign.

## 2017-11-04 ENCOUNTER — Ambulatory Visit: Payer: BC Managed Care – PPO

## 2017-11-04 VITALS — BP 169/98 | HR 64 | Ht 63.0 in | Wt 154.0 lb

## 2017-11-04 DIAGNOSIS — R030 Elevated blood-pressure reading, without diagnosis of hypertension: Secondary | ICD-10-CM

## 2017-11-04 NOTE — Progress Notes (Signed)
Patient has a primary care provider and instructed to call them and follow up with Primary care provider. Patient denies any headaches, dizziness, or blurry vision. Instructed patient to call me back with her appointment date with primary care provider Dr. Renae Fickle [facility] physicians). Will route to provider for review.  Kathrene Alu RN

## 2019-06-08 ENCOUNTER — Other Ambulatory Visit: Payer: Self-pay | Admitting: Internal Medicine

## 2019-06-08 DIAGNOSIS — N6489 Other specified disorders of breast: Secondary | ICD-10-CM

## 2019-06-16 ENCOUNTER — Ambulatory Visit: Payer: BC Managed Care – PPO

## 2019-06-16 ENCOUNTER — Other Ambulatory Visit: Payer: Self-pay

## 2019-06-16 ENCOUNTER — Ambulatory Visit
Admission: RE | Admit: 2019-06-16 | Discharge: 2019-06-16 | Disposition: A | Discharge status: Home / Self Care | Payer: BC Managed Care – PPO | Source: Ambulatory Visit | Attending: Internal Medicine | Admitting: Internal Medicine

## 2019-06-16 DIAGNOSIS — N6489 Other specified disorders of breast: Secondary | ICD-10-CM

## 2019-08-14 ENCOUNTER — Ambulatory Visit: Payer: BC Managed Care – PPO | Attending: Internal Medicine

## 2019-08-14 DIAGNOSIS — Z23 Encounter for immunization: Secondary | ICD-10-CM | POA: Insufficient documentation

## 2019-08-14 NOTE — Progress Notes (Signed)
   Covid-19 Vaccination Clinic  Name:  Kristin Johnston    MRN: [id number] DOB: [birth year]  08/14/2019  [name] was observed post Covid-19 immunization for 15 minutes without incidence. She was provided with Vaccine Information Sheet and instruction to access the V-Safe system.   [name] was instructed to call 911 with any severe reactions post vaccine: Marland Kitchen Difficulty breathing  . Swelling of your face and throat  . A fast heartbeat  . A bad rash all over your body  . Dizziness and weakness    Immunizations Administered    Name Date Dose VIS Date Route   Pfizer COVID-19 Vaccine 08/14/2019  6:34 PM 0.3 mL 06/19/2019 Intramuscular   Manufacturer: [place]   Lot: [id number]   [place]: [id number]

## 2019-08-15 ENCOUNTER — Ambulatory Visit: Payer: BC Managed Care – PPO

## 2019-09-08 ENCOUNTER — Ambulatory Visit: Payer: BC Managed Care – PPO | Attending: Internal Medicine

## 2019-09-08 ENCOUNTER — Ambulatory Visit: Payer: BC Managed Care – PPO

## 2019-09-08 DIAGNOSIS — Z23 Encounter for immunization: Secondary | ICD-10-CM | POA: Insufficient documentation

## 2019-09-08 NOTE — Progress Notes (Signed)
   Covid-19 Vaccination Clinic  Name:  Kristin Johnston    MRN: [id number] DOB: 14-Jul-1965  09/08/2019  [name] was observed post Covid-19 immunization for 15 minutes without incident. She was provided with Vaccine Information Sheet and instruction to access the V-Safe system.   [name] was instructed to call 911 with any severe reactions post vaccine: Marland Kitchen Difficulty breathing  . Swelling of face and throat  . A fast heartbeat  . A bad rash all over body  . Dizziness and weakness   Immunizations Administered    Name Date Dose VIS Date Route   Pfizer COVID-19 Vaccine 09/08/2019 12:04 PM 0.3 mL 06/19/2019 Intramuscular   Manufacturer: [place]   Lot: [id number]   [place]: [id number]

## 2020-05-23 ENCOUNTER — Other Ambulatory Visit: Payer: Self-pay | Admitting: Internal Medicine

## 2020-05-23 DIAGNOSIS — Z1231 Encounter for screening mammogram for malignant neoplasm of breast: Secondary | ICD-10-CM

## 2020-06-30 ENCOUNTER — Other Ambulatory Visit: Payer: Self-pay

## 2020-06-30 ENCOUNTER — Ambulatory Visit
Admission: RE | Admit: 2020-06-30 | Discharge: 2020-06-30 | Disposition: A | Discharge status: Home / Self Care | Payer: BC Managed Care – PPO | Source: Ambulatory Visit | Attending: Internal Medicine | Admitting: Internal Medicine

## 2020-06-30 DIAGNOSIS — Z1231 Encounter for screening mammogram for malignant neoplasm of breast: Secondary | ICD-10-CM

## 2021-06-08 ENCOUNTER — Other Ambulatory Visit: Payer: Self-pay | Admitting: Critical Care Medicine

## 2021-06-08 ENCOUNTER — Other Ambulatory Visit: Payer: Self-pay | Admitting: Internal Medicine

## 2021-06-08 DIAGNOSIS — Z1231 Encounter for screening mammogram for malignant neoplasm of breast: Secondary | ICD-10-CM

## 2021-07-11 ENCOUNTER — Ambulatory Visit
Admission: RE | Admit: 2021-07-11 | Discharge: 2021-07-11 | Disposition: A | Discharge status: Home / Self Care | Payer: BC Managed Care – PPO | Source: Ambulatory Visit | Attending: Internal Medicine | Admitting: Internal Medicine

## 2021-07-11 DIAGNOSIS — Z1231 Encounter for screening mammogram for malignant neoplasm of breast: Secondary | ICD-10-CM

## 2022-08-21 ENCOUNTER — Other Ambulatory Visit: Payer: Self-pay | Admitting: Internal Medicine

## 2022-08-21 DIAGNOSIS — Z1231 Encounter for screening mammogram for malignant neoplasm of breast: Secondary | ICD-10-CM

## 2022-10-08 ENCOUNTER — Ambulatory Visit
Admission: RE | Admit: 2022-10-08 | Discharge: 2022-10-08 | Disposition: A | Discharge status: Home / Self Care | Payer: BC Managed Care – PPO | Source: Ambulatory Visit | Attending: Internal Medicine | Admitting: Internal Medicine

## 2022-10-08 DIAGNOSIS — Z1231 Encounter for screening mammogram for malignant neoplasm of breast: Secondary | ICD-10-CM

## 2023-10-29 ENCOUNTER — Other Ambulatory Visit: Payer: Self-pay | Admitting: Internal Medicine

## 2023-10-29 DIAGNOSIS — Z1231 Encounter for screening mammogram for malignant neoplasm of breast: Secondary | ICD-10-CM

## 2023-11-13 ENCOUNTER — Ambulatory Visit
Admission: RE | Admit: 2023-11-13 | Discharge: 2023-11-13 | Disposition: A | Discharge status: Home / Self Care | Source: Ambulatory Visit | Attending: Internal Medicine | Admitting: Internal Medicine

## 2023-11-13 DIAGNOSIS — Z1231 Encounter for screening mammogram for malignant neoplasm of breast: Secondary | ICD-10-CM
